# Patient Record
Sex: Female | Born: 1990 | Race: White | Hispanic: No | Smoking: Never smoker
Health system: Southern US, Community
[De-identification: ages and names within clinical notes are randomized; demographics above are authoritative.]

## PROBLEM LIST (undated history)

## (undated) ENCOUNTER — Ambulatory Visit (HOSPITAL_COMMUNITY): Admission: EM | Payer: Self-pay | Source: Home / Self Care

## (undated) DIAGNOSIS — G4700 Insomnia, unspecified: Secondary | ICD-10-CM

## (undated) DIAGNOSIS — E785 Hyperlipidemia, unspecified: Secondary | ICD-10-CM

## (undated) DIAGNOSIS — F329 Major depressive disorder, single episode, unspecified: Secondary | ICD-10-CM

## (undated) DIAGNOSIS — K8020 Calculus of gallbladder without cholecystitis without obstruction: Secondary | ICD-10-CM

## (undated) DIAGNOSIS — L0591 Pilonidal cyst without abscess: Secondary | ICD-10-CM

## (undated) DIAGNOSIS — F32A Depression, unspecified: Secondary | ICD-10-CM

## (undated) DIAGNOSIS — D649 Anemia, unspecified: Secondary | ICD-10-CM

## (undated) DIAGNOSIS — E079 Disorder of thyroid, unspecified: Secondary | ICD-10-CM

## (undated) DIAGNOSIS — F419 Anxiety disorder, unspecified: Secondary | ICD-10-CM

## (undated) HISTORY — DX: Anxiety disorder, unspecified: F41.9

## (undated) HISTORY — DX: Hyperlipidemia, unspecified: E78.5

## (undated) HISTORY — DX: Depression, unspecified: F32.A

## (undated) HISTORY — DX: Insomnia, unspecified: G47.00

## (undated) HISTORY — DX: Anemia, unspecified: D64.9

## (undated) HISTORY — DX: Disorder of thyroid, unspecified: E07.9

## (undated) HISTORY — PX: OTHER SURGICAL HISTORY: SHX169

## (undated) HISTORY — PX: SURGERY OF LIP: SUR1315

---
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
## 2017-11-22 ENCOUNTER — Ambulatory Visit: Payer: Self-pay | Admitting: General Surgery

## 2017-11-22 ENCOUNTER — Encounter: Payer: Self-pay | Admitting: General Surgery

## 2017-11-22 VITALS — BP 115/65 | HR 95 | Temp 98.7°F | Resp 18 | Ht 67.0 in | Wt 133.0 lb

## 2017-11-22 DIAGNOSIS — L0502 Pilonidal sinus with abscess: Secondary | ICD-10-CM

## 2017-11-22 MED ORDER — SULFAMETHOXAZOLE-TRIMETHOPRIM 800-160 MG PO TABS: 1.0000 | ORAL_TABLET | Freq: Two times a day (BID) | ORAL | 0 refills | Status: AC

## 2017-11-22 NOTE — Patient Instructions (Signed)
Pilonidal Cyst A pilonidal cyst is a fluid-filled sac. It forms beneath the skin near your tailbone, at the top of the crease of your buttocks. A pilonidal cyst that is not large or infected may not cause symptoms or problems. If the cyst becomes irritated or infected, it may fill with pus. This causes pain and swelling (pilonidal abscess). An infected cyst may need to be treated with medicine, drained, or removed. What are the causes? The cause of a pilonidal cyst is not known. One cause may be a hair that grows into your skin (ingrown hair). What increases the risk? Pilonidal cysts are more common in boys and men. Risk factors include:  Having lots of hair near the crease of the buttocks.  Being overweight.  Having a pilonidal dimple.  Wearing tight clothing.  Not bathing or showering frequently.  Sitting for long periods of time.  What are the signs or symptoms? Signs and symptoms of a pilonidal cyst may include:  Redness.  Pain and tenderness.  Warmth.  Swelling.  Pus.  Fever.  How is this diagnosed? Your health care provider may diagnose a pilonidal cyst based on your symptoms and a physical exam. The health care provider may do a blood test to check for infection. If your cyst is draining pus, your health care provider may take a sample of the drainage to be tested at a laboratory. How is this treated? Surgery is the usual treatment for an infected pilonidal cyst. You may also have to take medicines before surgery. The type of surgery you have depends on the size and severity of the infected cyst. The different kinds of surgery include:  Incision and drainage. This is a procedure to open and drain the cyst.  Marsupialization. In this procedure, a large cyst or abscess may be opened and kept open by stitching the edges of the skin to the cyst walls.  Cyst removal. This procedure involves opening the skin and removing all or part of the cyst.  Follow these  instructions at home:  Follow all of your surgeon's instructions carefully if you had surgery.  Take medicines only as directed by your health care provider.  If you were prescribed an antibiotic medicine, finish it all even if you start to feel better.  Keep the area around your pilonidal cyst clean and dry.  Clean the area as directed by your health care provider. Pat the area dry with a clean towel. Do not rub it as this may cause bleeding.  Remove hair from the area around the cyst as directed by your health care provider.  Do not wear tight clothing or sit in one place for long periods of time.  There are many different ways to close and cover an incision, including stitches, skin glue, and adhesive strips. Follow your health care provider's instructions on: ? Incision care. ? Bandage (dressing) changes and removal. ? Incision closure removal. Contact a health care provider if:  You have drainage, redness, swelling, or pain at the site of the cyst.  You have a fever. This information is not intended to replace advice given to you by your health care provider. Make sure you discuss any questions you have with your health care provider. Document Released: 07/14/2000 Document Revised: 12/23/2015 Document Reviewed: 12/04/2013 Elsevier Interactive Patient Education  2018 Elsevier Inc.  

## 2017-11-22 NOTE — Progress Notes (Signed)
[facility] History and Physical  Reason for Referral: ? Pilonidal Cyst  Referring Physician:  Dr. Wende CreaseGuarino   Chief Complaint    Cyst      Olivia Sellers is a 27 y.o. female.  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  It has started to get more swollen and red and will occasionally drain clear drainage/ and bloody drainage. It is tender when it is swollen, and for the last few weeks it has been swollen. 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  She denies any fevers or chills, and her sister has had a similar issue in the past but hers resolved with antibiotics.    She is otherwise healthy and takes no medications.    History reviewed. No pertinent past medical history.  Past Surgical History:  Procedure Laterality Date  . SURGERY OF LIP     suture to lip at age 89   Sister with likely pilonidal also  History reviewed. No pertinent family history.  Social History   Tobacco Use  . Smoking status: Never Smoker  . Smokeless tobacco: Never Used  Substance Use Topics  . Alcohol use: Not Currently    Frequency: Never  . Drug use: Never    Medications: I have reviewed the patient's current medications. Allergies as of 11/22/2017   Not on File     Medication List        Accurate as of 11/22/17 11:59 PM. Always use your most recent med list.          sulfamethoxazole-trimethoprim 800-160 MG tablet Commonly known as:  BACTRIM DS,SEPTRA DS Take 1 tablet by mouth 2 (two) times daily for 10 days.        ROS:  A comprehensive review of systems was negative except for: Gastrointestinal: positive for abdominal pain, nausea and reflux symptoms boil on gluteal cleft/ left buttock region  Blood pressure 115/65, pulse 95, temperature 98.7 F (37.1 C), resp. rate 18, height 5\' 7"  (1.702 m), weight 133 lb (60.3 kg). Physical Exam    Constitutional: She is oriented to person, place, and time. She appears well-developed and well-nourished.  HENT:  Head: Normocephalic and atraumatic.  Eyes: Pupils are equal, round, and reactive to light. EOM are normal.  Neck: Normal range of motion.  Cardiovascular: Normal rate and regular rhythm.  Pulmonary/Chest: Effort normal and breath sounds normal.  Abdominal: Soft. She exhibits no distension. There is no tenderness.  Genitourinary:  Genitourinary Comments: Gluteal cleft region with small pit superiorly, indurated track extending to left of midline with <1cm size red papulae without drainage, tender  Musculoskeletal: Normal range of motion. She exhibits no edema.  Neurological: She is alert and oriented to person, place, and time.  Skin: Skin is warm and dry.  Vitals reviewed.   Results: None   Assessment & Plan:  Olivia Sellers is a 27 y.o. 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  She is otherwise healthy and has no evidence of other cystic tracks.    - This is causing her some annoyance, and is tender when it becomes inflamed - She is unsure at this time if she wants to pursue surgery or not, and is self pay - We have given her the  information for the financial assistance people to see what this would cost out of pocket - We would plan for a pilonidal cyst excision with marsupialization of the tissue to help in healing, we have discussed the risk and benefits of this procedure including bleeding, infection, risk of wound splitting open, risk of recurrence.   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   Lucretia Roers 11/23/2017,  11:57 AM

## 2017-11-23 ENCOUNTER — Encounter: Payer: Self-pay | Admitting: General Surgery

## 2019-06-18 ENCOUNTER — Encounter: Payer: Self-pay | Admitting: Family Medicine

## 2019-06-18 ENCOUNTER — Other Ambulatory Visit: Payer: Self-pay

## 2019-06-18 ENCOUNTER — Encounter (INDEPENDENT_AMBULATORY_CARE_PROVIDER_SITE_OTHER): Payer: Self-pay

## 2019-06-18 ENCOUNTER — Ambulatory Visit (INDEPENDENT_AMBULATORY_CARE_PROVIDER_SITE_OTHER): Payer: Self-pay | Admitting: Family Medicine

## 2019-06-18 VITALS — BP 98/70 | HR 100 | Temp 98.8°F | Resp 12 | Ht 67.5 in | Wt 131.1 lb

## 2019-06-18 DIAGNOSIS — M545 Low back pain, unspecified: Secondary | ICD-10-CM

## 2019-06-18 DIAGNOSIS — F418 Other specified anxiety disorders: Secondary | ICD-10-CM | POA: Insufficient documentation

## 2019-06-18 DIAGNOSIS — F329 Major depressive disorder, single episode, unspecified: Secondary | ICD-10-CM

## 2019-06-18 DIAGNOSIS — F419 Anxiety disorder, unspecified: Secondary | ICD-10-CM

## 2019-06-18 DIAGNOSIS — F32A Depression, unspecified: Secondary | ICD-10-CM

## 2019-06-18 DIAGNOSIS — G4726 Circadian rhythm sleep disorder, shift work type: Secondary | ICD-10-CM

## 2019-06-18 DIAGNOSIS — Z30011 Encounter for initial prescription of contraceptive pills: Secondary | ICD-10-CM

## 2019-06-18 DIAGNOSIS — Z124 Encounter for screening for malignant neoplasm of cervix: Secondary | ICD-10-CM

## 2019-06-18 LAB — POCT URINE PREGNANCY: Preg Test, Ur: NEGATIVE

## 2019-06-18 MED ORDER — NORGESTIMATE-ETH ESTRADIOL 0.25-35 MG-MCG PO TABS: 1.0000 | ORAL_TABLET | Freq: Every day | ORAL | 3 refills | Status: DC

## 2019-06-18 NOTE — Patient Instructions (Addendum)
  I appreciate the opportunity to provide you with care for your health and wellness. Today we discussed: establish care   Follow up: 12 month with annual, no pap   No labs or referrals today  GOALS: Increase water intake Get iron-veggies and fruits, beans   BC sent pharmacy   I hope you have a wonderful, happy, safe, and healthy Holiday Season! See you in the New Year :)  Please continue to practice social distancing to keep you, your family, and our community safe.  If you must go out, please wear a mask and practice good handwashing.  It was a pleasure to see you and I look forward to continuing to work together on your health and well-being. Please do not hesitate to call the office if you need care or have questions about your care.  Have a wonderful day and week. With Gratitude, Cherly Beach, DNP, AGNP-BC

## 2019-06-18 NOTE — Progress Notes (Signed)
Subjective:     Patient ID: Olivia Sellers, female   DOB: 20-Sep-1990, 28 y.o.   MRN: [id number]  Olivia Sellers presents for New Patient (Initial Visit) (establish care)  [name] is a 28 year old female patient who presents today to establish care.  Is currently not had a primary care provider in some time.  Usually goes to the health department for her birth control needs.  Currently is working as a Engineer, civil (consulting)nurse at [employer], works night shift.  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  Has a significant history including depression anxiety and anemia.  Family history is consistent for alcohol abuse of mother and father, bipolar disorder of her father schizoaffective disorder in father, depression in mother, COPD/emphysema in mother, panic disorder in mother drug abuse in mother.  Never smoker but did grow up in a home with smokers.  Does not currently have any signs and symptoms of asthma.  Socially drinks alcohol never used drugs sexually active right now uses birth control needs refill today.  Reports not having a Pap ever-mostly due to never having insurance.  Reports growing up in the impoverished home.  Is doing much better now her insurance will pick up in January would like to get in with the GYN for Pap smear.  Currently lives alone but is in a relationship with a significant other.  Feels safe with him.  No pets at home.  Does enjoy drawing in her spare time.  Reports she was eating a lots of fast food but now starting to eat a lot more vegetables and fruits and beans.  More vegetarian like meals.  Drinks about 3 cups of coffee a week.  Does not drink a lot of water but does drink a lot of milk.  Wears her seatbelt in some protection.  Has smoke detectors with good battery.  Does not use her phone while she is driving.  Health maintenance: As stated above needs Pap smear, HIV screening, flu vaccine was provided with [employer] system.  Reports that she had a tetanus while she was  starting school.  Reports that it was probably back in 2014 or 17 she is unsure of the year.  Can get her immunization forms from work to verify.  Today patient denies signs and symptoms of COVID 19 infection including fever, chills, cough, shortness of breath, and headache. Past Medical, Surgical, Social History, Allergies, and Medications have been Reviewed. Past Medical History:  Diagnosis Date  . Anemia    Past Surgical History:  Procedure Laterality Date  . SURGERY OF LIP     suture to lip at age 89  . wisdom teeth removed     Social History   Socioeconomic History  . Marital status: Single    Spouse name: Not on file  . Number of children: Not on file  . Years of education: Not on file  . Highest education level: Not on file  Occupational History  . Occupation: Charity fundraiserN  Social Needs  . Financial resource strain: Not on file  . Food insecurity    Worry: Not on file    Inability: Not on file  . Transportation needs    Medical: Not on file    Non-medical: Not on file  Tobacco Use  . Smoking status: Never Smoker  . Smokeless tobacco: Never Used  Substance and Sexual Activity  . Alcohol use: Not Currently    Frequency: Never  . Drug use: Never  .  Sexual activity: Yes  Lifestyle  . Physical activity    Days per week: Not on file    Minutes per session: Not on file  . Stress: Not on file  Relationships  . Social Musician on phone: Not on file    Gets together: Not on file    Attends religious service: Not on file    Active member of club or organization: Not on file    Attends meetings of clubs or organizations: Not on file    Relationship status: Not on file  . Intimate partner violence    Fear of current or ex partner: Not on file    Emotionally abused: Not on file    Physically abused: Not on file    Forced sexual activity: Not on file  Other Topics Concern  . Not on file  Social History Narrative  . Not on file    Outpatient Encounter  Medications as of 06/18/2019  Medication Sig  . norgestimate-ethinyl estradiol (SPRINTEC 28) 0.25-35 MG-MCG tablet Take 1 tablet by mouth daily.   No facility-administered encounter medications on file as of 06/18/2019.    No Known Allergies  Review of Systems  Constitutional: Negative for chills and fever.  HENT: Negative.   Eyes: Negative.  Negative for visual disturbance.       Glasses-need new ones   Respiratory: Negative.  Negative for cough and shortness of breath.   Cardiovascular: Negative.   Gastrointestinal: Negative.  Negative for constipation, diarrhea, nausea and vomiting.  Endocrine: Negative.   Genitourinary: Negative.   Musculoskeletal: Positive for back pain.  Skin: Negative.   Allergic/Immunologic: Negative.   Neurological: Negative.  Negative for dizziness and headaches.  Hematological: Negative.   Psychiatric/Behavioral: Positive for sleep disturbance. The patient is nervous/anxious.   All other systems reviewed and are negative.      Objective:     BP 98/70   Pulse 100   Temp 98.8 F (37.1 C) (Oral)   Resp 12   Ht 5' 7.5" (1.715 m)   Wt 131 lb 1.9 oz (59.5 kg)   SpO2 99%   BMI 20.23 kg/m   Physical Exam Vitals signs and nursing note reviewed.  Constitutional:      Appearance: Normal appearance. She is well-developed, well-groomed and normal weight.  HENT:     Head: Normocephalic and atraumatic.     Right Ear: External ear normal.     Left Ear: External ear normal.     Nose: Nose normal.     Mouth/Throat:     Mouth: Mucous membranes are moist.     Pharynx: Oropharynx is clear.  Eyes:     General:        Right eye: No discharge.        Left eye: No discharge.     Extraocular Movements: Extraocular movements intact.     Conjunctiva/sclera: Conjunctivae normal.  Neck:     Musculoskeletal: Normal range of motion and neck supple.  Cardiovascular:     Rate and Rhythm: Normal rate and regular rhythm.     Pulses: Normal pulses.     Heart  sounds: Normal heart sounds.  Pulmonary:     Effort: Pulmonary effort is normal.     Breath sounds: Normal breath sounds.  Musculoskeletal: Normal range of motion.  Skin:    General: Skin is warm.  Neurological:     General: No focal deficit present.     Mental Status: She is alert and oriented  to person, place, and time.  Psychiatric:        Attention and Perception: Attention normal.        Mood and Affect: Mood normal.        Speech: Speech normal.        Behavior: Behavior normal. Behavior is cooperative.        Thought Content: Thought content normal.        Cognition and Memory: Cognition normal.        Judgment: Judgment normal.        Assessment and Plan       1. Encounter for screening for malignant neoplasm of cervix Needs to have Pap smear.  Also needs to have screening tests performed.  Referral to OB.  Insurance does not pick up until January advised that she can make her appointment after this date.  - Ambulatory referral to Obstetrics / Gynecology  2. Encounter for initial prescription of contraceptive pills Has been off her birth control for over a month.  Took pregnancy test negative today. Restarted on Sprintec.  Referral to [facility] for possible change in type of birth control.  - Ambulatory referral to Obstetrics / Gynecology - norgestimate-ethinyl estradiol ([place] 28) 0.25-35 MG-MCG tablet; Take 1 tablet by mouth daily.  Dispense: 1 Package; Refill: 3 -POC pregnancy, urine  3. 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  Reports that she uses melatonin to help with her sleep schedule.  Overall doing okay.  4. Anxiety and depression Reports well controlled.  Is practicing yoga a lot more.   5. 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  Reports that her back is a lot better when she does her yoga.  She tries to do it twice a day sometimes  even.   Declines needing any medication at this time for this.   Follow-up November 2021 for annual no Pap  Perlie Mayo, DNP, AGNP-BC [address], [address], [place] [address] Office Hours: Mon-Thurs 8 am-5 pm; Fri 8 am-12 pm Office Phone:  [phone]  Office Fax: [phone]

## 2019-08-04 ENCOUNTER — Encounter: Payer: Self-pay | Admitting: Advanced Practice Midwife

## 2019-08-04 ENCOUNTER — Encounter: Payer: Self-pay | Admitting: Obstetrics & Gynecology

## 2019-08-09 ENCOUNTER — Other Ambulatory Visit: Payer: Self-pay

## 2019-08-09 ENCOUNTER — Encounter (HOSPITAL_COMMUNITY): Payer: Self-pay | Admitting: Emergency Medicine

## 2019-08-09 ENCOUNTER — Emergency Department (HOSPITAL_COMMUNITY): Payer: No Typology Code available for payment source

## 2019-08-09 ENCOUNTER — Emergency Department (HOSPITAL_COMMUNITY)
Admission: EM | Admit: 2019-08-09 | Discharge: 2019-08-09 | Disposition: A | Discharge status: Home / Self Care | Payer: No Typology Code available for payment source | Attending: Emergency Medicine | Admitting: Emergency Medicine

## 2019-08-09 DIAGNOSIS — R1011 Right upper quadrant pain: Secondary | ICD-10-CM | POA: Diagnosis not present

## 2019-08-09 DIAGNOSIS — R1013 Epigastric pain: Secondary | ICD-10-CM | POA: Diagnosis present

## 2019-08-09 DIAGNOSIS — K8020 Calculus of gallbladder without cholecystitis without obstruction: Secondary | ICD-10-CM | POA: Diagnosis not present

## 2019-08-09 DIAGNOSIS — Z79899 Other long term (current) drug therapy: Secondary | ICD-10-CM | POA: Diagnosis not present

## 2019-08-09 LAB — COMPREHENSIVE METABOLIC PANEL
ALT: 22 U/L (ref 0–44)
AST: 31 U/L (ref 15–41)
Albumin: 3.9 g/dL (ref 3.5–5.0)
Alkaline Phosphatase: 37 U/L — ABNORMAL LOW (ref 38–126)
Anion gap: 13 (ref 5–15)
BUN: 12 mg/dL (ref 6–20)
CO2: 20 mmol/L — ABNORMAL LOW (ref 22–32)
Calcium: 9.3 mg/dL (ref 8.9–10.3)
Chloride: 102 mmol/L (ref 98–111)
Creatinine, Ser: 0.68 mg/dL (ref 0.44–1.00)
GFR calc Af Amer: >60 mL/min (ref >60)
GFR calc non Af Amer: >60 mL/min (ref >60)
Glucose, Bld: 108 mg/dL — ABNORMAL HIGH (ref 70–99)
Potassium: 3.4 mmol/L — ABNORMAL LOW (ref 3.5–5.1)
Sodium: 135 mmol/L (ref 135–145)
Total Bilirubin: 0.7 mg/dL (ref 0.3–1.2)
Total Protein: 6.8 g/dL (ref 6.5–8.1)

## 2019-08-09 LAB — LIPASE, BLOOD: Lipase: 25 U/L (ref 11–51)

## 2019-08-09 LAB — CBC WITH DIFFERENTIAL/PLATELET
Abs Immature Granulocytes: 0.03 10*3/uL (ref 0.00–0.07)
Basophils Absolute: 0 10*3/uL (ref 0.0–0.1)
Basophils Relative: 0 %
Eosinophils Absolute: 0.1 10*3/uL (ref 0.0–0.5)
Eosinophils Relative: 1 %
HCT: 37.3 % (ref 36.0–46.0)
Hemoglobin: 12.7 g/dL (ref 12.0–15.0)
Immature Granulocytes: 0 %
Lymphocytes Relative: 30 %
Lymphs Abs: 2.7 10*3/uL (ref 0.7–4.0)
MCH: 30.2 pg (ref 26.0–34.0)
MCHC: 34 g/dL (ref 30.0–36.0)
MCV: 88.6 fL (ref 80.0–100.0)
Monocytes Absolute: 0.6 10*3/uL (ref 0.1–1.0)
Monocytes Relative: 6 %
Neutro Abs: 5.7 10*3/uL (ref 1.7–7.7)
Neutrophils Relative %: 63 %
Platelets: 264 10*3/uL (ref 150–400)
RBC: 4.21 MIL/uL (ref 3.87–5.11)
RDW: 12.3 % (ref 11.5–15.5)
WBC: 9.1 10*3/uL (ref 4.0–10.5)
nRBC: 0 % (ref 0.0–0.2)

## 2019-08-09 LAB — URINALYSIS, ROUTINE W REFLEX MICROSCOPIC
Bilirubin Urine: NEGATIVE
Glucose, UA: NEGATIVE mg/dL
Hgb urine dipstick: NEGATIVE
Ketones, ur: 20 mg/dL — AB
Leukocytes,Ua: NEGATIVE
Nitrite: NEGATIVE
Protein, ur: NEGATIVE mg/dL
Specific Gravity, Urine: 1.021 (ref 1.005–1.030)
pH: 5 (ref 5.0–8.0)

## 2019-08-09 LAB — I-STAT BETA HCG BLOOD, ED (MC, WL, AP ONLY): I-stat hCG, quantitative: <5 m[IU]/mL (ref <5)

## 2019-08-09 MED ORDER — MORPHINE SULFATE (PF) 4 MG/ML IV SOLN
4.0000 mg | Freq: Once | INTRAVENOUS | Status: AC
  Administered 2019-08-09: 4 mg via INTRAVENOUS
  Filled 2019-08-09: qty 1

## 2019-08-09 MED ORDER — ONDANSETRON HCL 4 MG/2ML IJ SOLN
4.0000 mg | Freq: Once | INTRAMUSCULAR | Status: AC
  Administered 2019-08-09: 02:00:00 4 mg via INTRAVENOUS
  Filled 2019-08-09: qty 2

## 2019-08-09 NOTE — ED Triage Notes (Signed)
Reports upper abdominal pain that radiates into the back.  Has had a similar episode after eating.  Denies nausea.

## 2019-08-09 NOTE — ED Notes (Signed)
Pt says that she is tolerating water without difficulty

## 2019-08-09 NOTE — Discharge Instructions (Addendum)
1. Medications: usual home medications 2. Treatment: rest, drink plenty of fluids, advance diet slowly 3. Follow Up: Please followup with general surgery in 2-3 days for further evaluation; Please return to the ER for worsening pain, persistent vomiting, high fevers or worsening symptoms

## 2019-08-09 NOTE — ED Notes (Signed)
Taken to US at this time. 

## 2019-08-09 NOTE — ED Provider Notes (Signed)
[facility] EMERGENCY DEPARTMENT Provider Note   CSN: [id number] Arrival date & time: 08/09/19  0125     History Chief Complaint  Patient presents with  . Abdominal Pain    Olivia Sellers is a 29 y.o. female with a hx of anemia, anxiety, depression presents to the Emergency Department complaining of gradual, persistent, progressively worsening epigastric and right upper quadrant abdominal pain that began around 9 PM this evening.  Patient reports patient is a 10/10, sharp and radiates to the right flank and right upper back.  She reports eating dinner around 5:30 PM without pain.  She denies nausea or vomiting.  Patient reports she had intermittent episodes like this, often associated with food for approximately 1 year.  She reports that yoga and changing positions often helps the pain.  Tonight nothing made her pain better.  She reports that standing and walking seems to make her pain worse.  She has never had any abdominal surgeries and has never had any evaluation of her gallbladder.  Patient denies vaginal and urinary symptoms.  The history is provided by the patient and medical records. No language interpreter was used.       Past Medical History:  Diagnosis Date  . Anemia   . Anxiety   . Depression     Patient Active Problem List   Diagnosis Date Noted  . Anxiety and depression 06/18/2019  . Shifting sleep-work schedule 06/18/2019    Past Surgical History:  Procedure Laterality Date  . SURGERY OF LIP     suture to lip at age 10  . wisdom teeth removed       OB History   No obstetric history on file.     Family History  Problem Relation Age of Onset  . Panic disorder Mother   . Drug abuse Mother   . Alcohol abuse Mother   . Depression Mother   . COPD Mother   . Bipolar disorder Father   . Alcohol abuse Father     Social History   Tobacco Use  . Smoking status: Never Smoker  . Smokeless tobacco: Never Used  Substance Use Topics  .  Alcohol use: Yes    Comment: socially   . Drug use: Never    Home Medications Prior to Admission medications   Medication Sig Start Date End Date Taking? Authorizing Provider  norgestimate-ethinyl estradiol (SPRINTEC 28) 0.25-35 MG-MCG tablet Take 1 tablet by mouth daily. 06/18/19  Yes Freddy Finner, NP    Allergies    Patient has no known allergies.  Review of Systems   Review of Systems  Constitutional: Negative for appetite change, diaphoresis, fatigue, fever and unexpected weight change.  HENT: Negative for mouth sores.   Eyes: Negative for visual disturbance.  Respiratory: Negative for cough, chest tightness, shortness of breath and wheezing.   Cardiovascular: Negative for chest pain.  Gastrointestinal: Positive for abdominal pain. Negative for constipation, diarrhea, nausea and vomiting.  Endocrine: Negative for polydipsia, polyphagia and polyuria.  Genitourinary: Positive for flank pain. Negative for dysuria, frequency, hematuria and urgency.  Musculoskeletal: Negative for back pain and neck stiffness.  Skin: Negative for rash.  Allergic/Immunologic: Negative for immunocompromised state.  Neurological: Negative for syncope, light-headedness and headaches.  Hematological: Does not bruise/bleed easily.  Psychiatric/Behavioral: Negative for sleep disturbance. The patient is not nervous/anxious.     Physical Exam Updated Vital Signs BP 119/79 (BP Location: Right Arm)   Pulse 93   Temp 97.8 F (36.6 C) (Oral)  Resp 16   Ht 5' 7.5" (1.715 m)   Wt 56.7 kg   SpO2 100%   BMI 19.29 kg/m   Physical Exam Vitals and nursing note reviewed.  Constitutional:      General: She is not in acute distress.    Appearance: She is not diaphoretic.  HENT:     Head: Normocephalic.  Eyes:     General: No scleral icterus.    Conjunctiva/sclera: Conjunctivae normal.  Cardiovascular:     Rate and Rhythm: Normal rate and regular rhythm.     Pulses: Normal pulses.          Radial  pulses are 2+ on the right side and 2+ on the left side.  Pulmonary:     Effort: No tachypnea, accessory muscle usage, prolonged expiration, respiratory distress or retractions.     Breath sounds: Normal breath sounds. No stridor. No wheezing or rhonchi.     Comments: Equal chest rise. No increased work of breathing. Abdominal:     General: There is no distension.     Palpations: Abdomen is soft.     Tenderness: There is abdominal tenderness in the right upper quadrant and epigastric area. There is guarding. There is no right CVA tenderness, left CVA tenderness or rebound.  Musculoskeletal:     Cervical back: Normal range of motion.     Comments: Moves all extremities equally and without difficulty.  Skin:    General: Skin is warm and dry.     Capillary Refill: Capillary refill takes less than 2 seconds.  Neurological:     Mental Status: She is alert.     GCS: GCS eye subscore is 4. GCS verbal subscore is 5. GCS motor subscore is 6.     Comments: Speech is clear and goal oriented.  Psychiatric:        Mood and Affect: Mood normal.     ED Results / Procedures / Treatments   Labs (all labs ordered are listed, but only abnormal results are displayed) Labs Reviewed  COMPREHENSIVE METABOLIC PANEL - Abnormal; Notable for the following components:      Result Value   Potassium 3.4 (*)    CO2 20 (*)    Glucose, Bld 108 (*)    Alkaline Phosphatase 37 (*)    All other components within normal limits  URINALYSIS, ROUTINE W REFLEX MICROSCOPIC - Abnormal; Notable for the following components:   Ketones, ur 20 (*)    All other components within normal limits  CBC WITH DIFFERENTIAL/PLATELET  LIPASE, BLOOD  I-STAT BETA HCG BLOOD, ED ([facility], [facility], [facility] ONLY)    Radiology US Abdomen Limited  Result Date: 08/09/2019 CLINICAL DATA:  Right upper quadrant pain after eating EXAM: ULTRASOUND ABDOMEN LIMITED RIGHT UPPER QUADRANT COMPARISON:  None. FINDINGS: Gallbladder: Multiple layering gallstones are  present. The largest measuring 3.6 mm. Gallbladder wall thickness measures 2 mm. Common bile duct: Diameter: 5.6 mm Liver: Increased echotexture seen throughout. No focal abnormality or biliary ductal dilatation. Portal vein is patent on color Doppler imaging with normal direction of blood flow towards the liver. Other: None. IMPRESSION: Cholelithiasis without definite evidence of acute cholecystitis. Hepatic steatosis. Electronically Signed   By: Prudencio Pair M.D.   On: 08/09/2019 03:05    Procedures Procedures (including critical care time)  Medications Ordered in ED Medications  morphine 4 MG/ML injection 4 mg (4 mg Intravenous Given 08/09/19 0209)  ondansetron (ZOFRAN) injection 4 mg (4 mg Intravenous Given 08/09/19 0216)    ED Course  I have reviewed the triage vital signs and the nursing notes.  Pertinent labs & imaging results that were available during my care of the patient were reviewed by me and considered in my medical decision making (see chart for details).  Clinical Course as of Aug 09 731  Sat Aug 09, 2019  0217 No leukocytosis  WBC: 9.1 [HM]  0217 Test negative  I-stat hCG, quantitative: <5.0 [HM]  0217 No evidence of urinary tract infection  Nitrite: NEGATIVE [HM]  0217 Afebrile without tachycardia or hypotension.  No evidence of sepsis.  Temp: 97.8 F (36.6 C) [HM]    Clinical Course User Index [HM] Sojourner Behringer, Boyd Kerbs   MDM Rules/Calculators/A&P                      Patient presents with severe epigastric and right upper quadrant abdominal pain.  She does have some guarding on exam.  Not specifically positive Murphy sign.  She appears very uncomfortable.  Will give pain meds.  Labs and imaging pending.  Suspect cholecystitis/cholelithiasis versus choledocholithiasis.  Will also consider pancreatitis.  No Covid symptoms.  04:00 Patient reports she is feeling better.  Her pain is almost resolved.  On repeat exam her abdomen is soft and nontender.  No Murphy  sign.  Ultrasound does show gallstones without evidence of cholecystitis or choledocholithiasis.  Suspect patient's pain is secondary to biliary colic.  Patient declines pain control and nausea medication for home.  She will need close outpatient follow-up with surgical team.  Discussed reasons to return to the emergency department and patient states understanding.   Final Clinical Impression(s) / ED Diagnoses Final diagnoses:  Right upper quadrant abdominal pain  Calculus of gallbladder without cholecystitis without obstruction    Rx / DC Orders ED Discharge Orders    None       Milta Deiters 08/09/19 2563    Zadie Rhine, MD 08/09/19 [phone]

## 2019-08-19 ENCOUNTER — Other Ambulatory Visit (HOSPITAL_COMMUNITY)
Admission: RE | Admit: 2019-08-19 | Discharge: 2019-08-19 | Disposition: A | Discharge status: Home / Self Care | Payer: No Typology Code available for payment source | Source: Ambulatory Visit | Attending: General Surgery | Admitting: General Surgery

## 2019-08-19 ENCOUNTER — Encounter (HOSPITAL_BASED_OUTPATIENT_CLINIC_OR_DEPARTMENT_OTHER): Payer: Self-pay | Admitting: General Surgery

## 2019-08-19 DIAGNOSIS — Z01812 Encounter for preprocedural laboratory examination: Secondary | ICD-10-CM | POA: Insufficient documentation

## 2019-08-19 DIAGNOSIS — Z20822 Contact with and (suspected) exposure to covid-19: Secondary | ICD-10-CM | POA: Insufficient documentation

## 2019-08-20 ENCOUNTER — Ambulatory Visit (INDEPENDENT_AMBULATORY_CARE_PROVIDER_SITE_OTHER): Payer: No Typology Code available for payment source | Admitting: Adult Health

## 2019-08-20 ENCOUNTER — Other Ambulatory Visit: Payer: Self-pay

## 2019-08-20 ENCOUNTER — Encounter: Payer: Self-pay | Admitting: Adult Health

## 2019-08-20 ENCOUNTER — Other Ambulatory Visit (HOSPITAL_COMMUNITY)
Admission: RE | Admit: 2019-08-20 | Discharge: 2019-08-20 | Disposition: A | Discharge status: Home / Self Care | Payer: No Typology Code available for payment source | Source: Ambulatory Visit | Attending: Obstetrics & Gynecology | Admitting: Obstetrics & Gynecology

## 2019-08-20 ENCOUNTER — Encounter (HOSPITAL_BASED_OUTPATIENT_CLINIC_OR_DEPARTMENT_OTHER): Payer: Self-pay | Admitting: General Surgery

## 2019-08-20 ENCOUNTER — Ambulatory Visit: Payer: Self-pay | Admitting: General Surgery

## 2019-08-20 VITALS — BP 105/71 | HR 102 | Ht 67.5 in | Wt 131.0 lb

## 2019-08-20 DIAGNOSIS — Z01419 Encounter for gynecological examination (general) (routine) without abnormal findings: Secondary | ICD-10-CM | POA: Insufficient documentation

## 2019-08-20 DIAGNOSIS — Z3009 Encounter for other general counseling and advice on contraception: Secondary | ICD-10-CM | POA: Insufficient documentation

## 2019-08-20 HISTORY — DX: Encounter for gynecological examination (general) (routine) without abnormal findings: Z01.419

## 2019-08-20 HISTORY — DX: Encounter for other general counseling and advice on contraception: Z30.09

## 2019-08-20 LAB — NOVEL CORONAVIRUS, NAA (HOSP ORDER, SEND-OUT TO REF LAB; TAT 18-24 HRS): SARS-CoV-2, NAA: NOT DETECTED

## 2019-08-20 NOTE — Progress Notes (Signed)
Spoke w/ via phone for pre-op interview---[facility] needs dos----   Ask mda if needs urine poct, urine I state beta hcg done 08-09-2019  Last menustral cycle 08-12-2019        Lab results------cbc with dif, I stat hcg blood, cmet, ua done 08-09-2019 chart/epic COVID test ------08-19-2019 Arrive at -------830 am 08-22-2019 NPO after ------midnight Medications to take morning of surgery -----none Diabetic medication -----n/a Patient Special Instructions ----- Pre-Op special Istructions ----- Patient verbalized understanding of instructions that were given at this phone interview. Patient denies shortness of breath, chest pain, fever, cough a this phone interview.

## 2019-08-20 NOTE — Progress Notes (Signed)
Patient ID: Olivia Sellers, female   DOB: 19-May-1991, 29 y.o.   MRN: [id number] History of Present Illness:  Olivia Sellers is a 29 year old white female, single, G0P0, in for a well woman gyn exam and her first pap.She is Olivia Sellers at [employer].She is having Gallbladder surgery 08/22/19 at [facility]. She is on OCs but wants to talk IUD and nexplanon. PCP is Tereasa Coop NP.  Current Medications, Allergies, Past Medical History, Past Surgical History, Family History and Social History were reviewed in [employer] record.     Review of Systems: Patient denies any headaches, hearing loss, fatigue, blurred vision, shortness of breath, chest pain, abdominal pain, problems with bowel movements, urination, or intercourse. No joint pain or mood swings.    Physical Exam:BP 105/71 (BP Location: Left Arm, Patient Position: Sitting, Cuff Size: Normal)   Pulse (!) 102   Ht 5' 7.5" (1.715 m)   Wt 131 lb (59.4 kg)   LMP 08/12/2019 (Exact Date)   BMI 20.21 kg/m  General:  Well developed, well nourished, no acute distress Skin:  Warm and dry Neck:  Midline trachea, normal thyroid, good ROM, no lymphadenopathy Lungs; Clear to auscultation bilaterally Breast:  No dominant palpable mass, retraction, or nipple discharge Cardiovascular: Regular rate and rhythm Abdomen:  Soft, non tender, no hepatosplenomegaly Pelvic:  External genitalia is normal in appearance, no lesions.  The vagina is normal in appearance. Urethra has no lesions or masses. The cervix is nulliparous, pap with high risk HPV 16/18 genotyping performed.  Uterus is felt to be normal size, shape, and contour.  No adnexal masses or tenderness noted.Bladder is non tender, no masses felt. Extremities/musculoskeletal:  No swelling or varicosities noted, no clubbing or cyanosis Psych:  No mood changes, alert and cooperative,seems happy Fall risk is low PHQ 2 score 0. Examination chaperoned by Marchelle Folks Rash LPN.  Impression and Plan: 1.  Encounter for gynecological examination with Papanicolaou smear of cervix Pap sent Physical in 1 year Pap in 3 if normal Labs with PCP Mammogram at 41  2. General counseling and advice for contraceptive management Discussed IUD vs Nexplanon and is wants nexplanon Review handout on nexplanon and IUD Continue OCs and use condoms  Return 2/9 for Nexplanon insertion

## 2019-08-21 NOTE — Anesthesia Preprocedure Evaluation (Addendum)
Anesthesia Evaluation  Patient identified by MRN, date of birth, ID band Patient awake    Reviewed: Allergy & Precautions, NPO status , Patient's Chart, lab work & pertinent test results  History of Anesthesia Complications Negative for: history of anesthetic complications  Airway Mallampati: I  TM Distance: >3 FB Neck ROM: Full    Dental no notable dental hx.    Pulmonary neg pulmonary ROS,    Pulmonary exam normal        Cardiovascular negative cardio ROS Normal cardiovascular exam     Neuro/Psych Anxiety Depression negative neurological ROS     GI/Hepatic Neg liver ROS, Cholelithiasis   Endo/Other  negative endocrine ROS  Renal/GU negative Renal ROS  negative genitourinary   Musculoskeletal negative musculoskeletal ROS (+)   Abdominal   Peds  Hematology negative hematology ROS (+)   Anesthesia Other Findings Day of surgery medications reviewed with patient.  Reproductive/Obstetrics negative OB ROS                            Anesthesia Physical Anesthesia Plan  ASA: I  Anesthesia Plan: General   Post-op Pain Management:    Induction: Intravenous  PONV Risk Score and Plan: Treatment may vary due to age or medical condition, Ondansetron, Dexamethasone, Midazolam and Scopolamine patch - Pre-op  Airway Management Planned: Oral ETT  Additional Equipment: None  Intra-op Plan:   Post-operative Plan: Extubation in OR  Informed Consent: I have reviewed the patients History and Physical, chart, labs and discussed the procedure including the risks, benefits and alternatives for the proposed anesthesia with the patient or authorized representative who has indicated his/her understanding and acceptance.     Dental advisory given  Plan Discussed with: CRNA  Anesthesia Plan Comments:        Anesthesia Quick Evaluation

## 2019-08-22 ENCOUNTER — Ambulatory Visit (HOSPITAL_BASED_OUTPATIENT_CLINIC_OR_DEPARTMENT_OTHER): Payer: No Typology Code available for payment source | Admitting: Anesthesiology

## 2019-08-22 ENCOUNTER — Other Ambulatory Visit: Payer: Self-pay

## 2019-08-22 ENCOUNTER — Encounter (HOSPITAL_BASED_OUTPATIENT_CLINIC_OR_DEPARTMENT_OTHER)
Admission: RE | Disposition: A | Discharge status: Home / Self Care | Payer: Self-pay | Source: Home / Self Care | Attending: General Surgery

## 2019-08-22 ENCOUNTER — Encounter (HOSPITAL_BASED_OUTPATIENT_CLINIC_OR_DEPARTMENT_OTHER): Payer: Self-pay | Admitting: General Surgery

## 2019-08-22 ENCOUNTER — Ambulatory Visit (HOSPITAL_BASED_OUTPATIENT_CLINIC_OR_DEPARTMENT_OTHER)
Admission: RE | Admit: 2019-08-22 | Discharge: 2019-08-22 | Disposition: A | Discharge status: Home / Self Care | Payer: No Typology Code available for payment source | Attending: General Surgery | Admitting: General Surgery

## 2019-08-22 DIAGNOSIS — Z811 Family history of alcohol abuse and dependence: Secondary | ICD-10-CM | POA: Diagnosis not present

## 2019-08-22 DIAGNOSIS — K8010 Calculus of gallbladder with chronic cholecystitis without obstruction: Secondary | ICD-10-CM | POA: Diagnosis not present

## 2019-08-22 DIAGNOSIS — F419 Anxiety disorder, unspecified: Secondary | ICD-10-CM | POA: Insufficient documentation

## 2019-08-22 DIAGNOSIS — Z825 Family history of asthma and other chronic lower respiratory diseases: Secondary | ICD-10-CM | POA: Diagnosis not present

## 2019-08-22 DIAGNOSIS — Z818 Family history of other mental and behavioral disorders: Secondary | ICD-10-CM | POA: Insufficient documentation

## 2019-08-22 DIAGNOSIS — K8020 Calculus of gallbladder without cholecystitis without obstruction: Secondary | ICD-10-CM | POA: Diagnosis present

## 2019-08-22 DIAGNOSIS — F329 Major depressive disorder, single episode, unspecified: Secondary | ICD-10-CM | POA: Insufficient documentation

## 2019-08-22 DIAGNOSIS — D649 Anemia, unspecified: Secondary | ICD-10-CM | POA: Diagnosis not present

## 2019-08-22 HISTORY — DX: Pilonidal cyst without abscess: L05.91

## 2019-08-22 HISTORY — DX: Calculus of gallbladder without cholecystitis without obstruction: K80.20

## 2019-08-22 HISTORY — PX: CHOLECYSTECTOMY: SHX55

## 2019-08-22 SURGERY — LAPAROSCOPIC CHOLECYSTECTOMY
Anesthesia: General | Site: Abdomen

## 2019-08-22 MED ORDER — PROPOFOL 10 MG/ML IV BOLUS
INTRAVENOUS | Status: DC | PRN
  Administered 2019-08-22: 130 mg via INTRAVENOUS

## 2019-08-22 MED ORDER — FENTANYL CITRATE (PF) 100 MCG/2ML IJ SOLN
INTRAMUSCULAR | Status: AC
  Filled 2019-08-22: qty 2

## 2019-08-22 MED ORDER — OXYCODONE HCL 5 MG PO TABS
ORAL_TABLET | ORAL | Status: AC
  Filled 2019-08-22: qty 1

## 2019-08-22 MED ORDER — LACTATED RINGERS IV SOLN
INTRAVENOUS | Status: DC
  Filled 2019-08-22: qty 1000

## 2019-08-22 MED ORDER — CHLORHEXIDINE GLUCONATE CLOTH 2 % EX PADS
6.0000 | MEDICATED_PAD | Freq: Once | CUTANEOUS | Status: DC
  Filled 2019-08-22: qty 6

## 2019-08-22 MED ORDER — SCOPOLAMINE 1 MG/3DAYS TD PT72
1.0000 | MEDICATED_PATCH | Freq: Once | TRANSDERMAL | Status: DC
  Administered 2019-08-22: 09:00:00 1.5 mg via TRANSDERMAL
  Filled 2019-08-22: qty 1

## 2019-08-22 MED ORDER — SCOPOLAMINE 1 MG/3DAYS TD PT72
MEDICATED_PATCH | TRANSDERMAL | Status: AC
  Filled 2019-08-22: qty 1

## 2019-08-22 MED ORDER — PROMETHAZINE HCL 25 MG/ML IJ SOLN
6.2500 mg | INTRAMUSCULAR | Status: DC | PRN
  Administered 2019-08-22: 6.25 mg via INTRAVENOUS
  Filled 2019-08-22: qty 1

## 2019-08-22 MED ORDER — CELECOXIB 200 MG PO CAPS
ORAL_CAPSULE | ORAL | Status: AC
  Filled 2019-08-22: qty 1

## 2019-08-22 MED ORDER — MIDAZOLAM HCL 5 MG/5ML IJ SOLN
INTRAMUSCULAR | Status: DC | PRN
  Administered 2019-08-22: 2 mg via INTRAVENOUS

## 2019-08-22 MED ORDER — ROCURONIUM BROMIDE 10 MG/ML (PF) SYRINGE
PREFILLED_SYRINGE | INTRAVENOUS | Status: DC | PRN
  Administered 2019-08-22: 30 mg via INTRAVENOUS

## 2019-08-22 MED ORDER — CELECOXIB 200 MG PO CAPS
200.0000 mg | ORAL_CAPSULE | ORAL | Status: AC
  Administered 2019-08-22: 09:00:00 200 mg via ORAL
  Filled 2019-08-22: qty 1

## 2019-08-22 MED ORDER — DEXAMETHASONE SODIUM PHOSPHATE 10 MG/ML IJ SOLN
INTRAMUSCULAR | Status: DC | PRN
  Administered 2019-08-22: 10 mg via INTRAVENOUS

## 2019-08-22 MED ORDER — CEFAZOLIN SODIUM-DEXTROSE 2-4 GM/100ML-% IV SOLN
INTRAVENOUS | Status: AC
  Filled 2019-08-22: qty 100

## 2019-08-22 MED ORDER — SUGAMMADEX SODIUM 200 MG/2ML IV SOLN
INTRAVENOUS | Status: DC | PRN
  Administered 2019-08-22: 150 mg via INTRAVENOUS

## 2019-08-22 MED ORDER — LIDOCAINE 2% (20 MG/ML) 5 ML SYRINGE
INTRAMUSCULAR | Status: DC | PRN
  Administered 2019-08-22: 60 mg via INTRAVENOUS

## 2019-08-22 MED ORDER — KETOROLAC TROMETHAMINE 30 MG/ML IJ SOLN
30.0000 mg | Freq: Once | INTRAMUSCULAR | Status: AC
  Administered 2019-08-22: 14:00:00 30 mg via INTRAVENOUS
  Filled 2019-08-22: qty 1

## 2019-08-22 MED ORDER — TRAMADOL HCL 50 MG PO TABS: 50.0000 mg | ORAL_TABLET | Freq: Four times a day (QID) | ORAL | 0 refills | Status: DC | PRN

## 2019-08-22 MED ORDER — PROMETHAZINE HCL 25 MG/ML IJ SOLN
INTRAMUSCULAR | Status: AC
  Filled 2019-08-22: qty 1

## 2019-08-22 MED ORDER — DEXAMETHASONE SODIUM PHOSPHATE 10 MG/ML IJ SOLN
INTRAMUSCULAR | Status: AC
  Filled 2019-08-22: qty 1

## 2019-08-22 MED ORDER — MIDAZOLAM HCL 2 MG/2ML IJ SOLN
INTRAMUSCULAR | Status: AC
  Filled 2019-08-22: qty 2

## 2019-08-22 MED ORDER — FENTANYL CITRATE (PF) 100 MCG/2ML IJ SOLN
INTRAMUSCULAR | Status: DC | PRN
  Administered 2019-08-22 (×4): 50 ug via INTRAVENOUS

## 2019-08-22 MED ORDER — OXYCODONE HCL 5 MG/5ML PO SOLN
5.0000 mg | Freq: Once | ORAL | Status: AC | PRN
  Filled 2019-08-22: qty 5

## 2019-08-22 MED ORDER — BUPIVACAINE-EPINEPHRINE (PF) 0.25% -1:200000 IJ SOLN
INTRAMUSCULAR | Status: DC | PRN
  Administered 2019-08-22: 12 mL

## 2019-08-22 MED ORDER — ROCURONIUM BROMIDE 10 MG/ML (PF) SYRINGE
PREFILLED_SYRINGE | INTRAVENOUS | Status: AC
  Filled 2019-08-22: qty 10

## 2019-08-22 MED ORDER — ONDANSETRON HCL 4 MG/2ML IJ SOLN
INTRAMUSCULAR | Status: DC | PRN
  Administered 2019-08-22: 4 mg via INTRAVENOUS

## 2019-08-22 MED ORDER — LIDOCAINE 2% (20 MG/ML) 5 ML SYRINGE
INTRAMUSCULAR | Status: AC
  Filled 2019-08-22: qty 5

## 2019-08-22 MED ORDER — PROPOFOL 10 MG/ML IV BOLUS
INTRAVENOUS | Status: AC
  Filled 2019-08-22: qty 20

## 2019-08-22 MED ORDER — ONDANSETRON HCL 4 MG/2ML IJ SOLN
INTRAMUSCULAR | Status: AC
  Filled 2019-08-22: qty 2

## 2019-08-22 MED ORDER — FENTANYL CITRATE (PF) 100 MCG/2ML IJ SOLN
25.0000 ug | INTRAMUSCULAR | Status: DC | PRN
  Administered 2019-08-22 (×2): 25 ug via INTRAVENOUS
  Administered 2019-08-22: 12:00:00 50 ug via INTRAVENOUS
  Filled 2019-08-22: qty 1

## 2019-08-22 MED ORDER — ACETAMINOPHEN 500 MG PO TABS
ORAL_TABLET | ORAL | Status: AC
  Filled 2019-08-22: qty 2

## 2019-08-22 MED ORDER — ACETAMINOPHEN 500 MG PO TABS
1000.0000 mg | ORAL_TABLET | ORAL | Status: AC
  Administered 2019-08-22: 09:00:00 1000 mg via ORAL
  Filled 2019-08-22: qty 2

## 2019-08-22 MED ORDER — KETOROLAC TROMETHAMINE 30 MG/ML IJ SOLN
INTRAMUSCULAR | Status: AC
  Filled 2019-08-22: qty 1

## 2019-08-22 MED ORDER — OXYCODONE HCL 5 MG PO TABS
5.0000 mg | ORAL_TABLET | Freq: Once | ORAL | Status: AC | PRN
  Administered 2019-08-22: 13:00:00 5 mg via ORAL
  Filled 2019-08-22: qty 1

## 2019-08-22 MED ORDER — CEFAZOLIN SODIUM-DEXTROSE 2-4 GM/100ML-% IV SOLN
2.0000 g | INTRAVENOUS | Status: AC
  Administered 2019-08-22: 10:00:00 2 g via INTRAVENOUS
  Filled 2019-08-22: qty 100

## 2019-08-22 SURGICAL SUPPLY — 35 items
CABLE HIGH FREQUENCY MONO STRZ (ELECTRODE) ×3 IMPLANT
CHLORAPREP W/TINT 26 (MISCELLANEOUS) ×3 IMPLANT
CLIP VESOLOCK MED LG 6/CT (CLIP) ×6 IMPLANT
COVER MAYO STAND STRL (DRAPES) ×3 IMPLANT
COVER TRANSDUCER ULTRASND (DRAPES) ×3 IMPLANT
DERMABOND ADVANCED (GAUZE/BANDAGES/DRESSINGS) ×2
DERMABOND ADVANCED .7 DNX12 (GAUZE/BANDAGES/DRESSINGS) ×1 IMPLANT
DRAPE C-ARM 42X120 X-RAY (DRAPES) ×3 IMPLANT
ELECT REM PT RETURN 15FT ADLT (MISCELLANEOUS) ×3 IMPLANT
GAUZE SPONGE 2X2 8PLY STRL LF (GAUZE/BANDAGES/DRESSINGS) ×1 IMPLANT
GLOVE BIO SURGEON STRL SZ7 (GLOVE) ×3 IMPLANT
GLOVE BIO SURGEON STRL SZ7.5 (GLOVE) ×3 IMPLANT
GLOVE BIOGEL PI IND STRL 7.0 (GLOVE) ×4 IMPLANT
GLOVE BIOGEL PI INDICATOR 7.0 (GLOVE) ×8
GOWN STRL REUS W/TWL LRG LVL3 (GOWN DISPOSABLE) ×6 IMPLANT
GOWN STRL REUS W/TWL XL LVL3 (GOWN DISPOSABLE) ×9 IMPLANT
GRASPER SUT TROCAR 14GX15 (MISCELLANEOUS) ×3 IMPLANT
IV NS IRRIG 3000ML ARTHROMATIC (IV SOLUTION) ×3 IMPLANT
KIT BASIN OR (CUSTOM PROCEDURE TRAY) ×3 IMPLANT
KIT TURNOVER CYSTO (KITS) ×3 IMPLANT
NEEDLE INSUFFLATION 14GA 120MM (NEEDLE) ×3 IMPLANT
PACK BASIN DAY SURGERY FS (CUSTOM PROCEDURE TRAY) ×3 IMPLANT
POUCH RETRIEVAL ECOSAC 10 (ENDOMECHANICALS) ×1 IMPLANT
POUCH RETRIEVAL ECOSAC 10MM (ENDOMECHANICALS) ×2
SCISSORS LAP 5X35 DISP (ENDOMECHANICALS) ×3 IMPLANT
SET CHOLANGIOGRAPH MIX (MISCELLANEOUS) ×3 IMPLANT
SET IRRIG TUBING LAPAROSCOPIC (IRRIGATION / IRRIGATOR) ×3 IMPLANT
SET TUBE SMOKE EVAC HIGH FLOW (TUBING) ×3 IMPLANT
SLEEVE XCEL OPT CAN 5 100 (ENDOMECHANICALS) ×3 IMPLANT
SPONGE GAUZE 2X2 STER 10/PKG (GAUZE/BANDAGES/DRESSINGS) ×2
SUT MNCRL AB 4-0 PS2 18 (SUTURE) ×3 IMPLANT
TOWEL OR 17X26 10 PK STRL BLUE (TOWEL DISPOSABLE) ×3 IMPLANT
TRAY LAPAROSCOPIC (CUSTOM PROCEDURE TRAY) ×3 IMPLANT
TROCAR BLADELESS OPT 5 100 (ENDOMECHANICALS) ×3 IMPLANT
TROCAR XCEL NON-BLD 11X100MML (ENDOMECHANICALS) ×3 IMPLANT

## 2019-08-22 NOTE — Discharge Instructions (Signed)
[facility] ______[facility] LAPAROSCOPIC SURGERY: POST OP INSTRUCTIONS Always review your discharge instruction sheet given to you by the facility where your surgery was performed. IF YOU HAVE DISABILITY OR FAMILY LEAVE FORMS, YOU MUST BRING THEM TO THE OFFICE FOR PROCESSING.   DO NOT GIVE THEM TO YOUR DOCTOR.  1. A prescription for pain medication may be given to you upon discharge.  Take your pain medication as prescribed, if needed.  If narcotic pain medicine is not needed, then you may take acetaminophen (Tylenol) or ibuprofen (Advil) as needed. 2. Take your usually prescribed medications unless otherwise directed. 3. If you need a refill on your pain medication, please contact your pharmacy.  They will contact our office to request authorization. Prescriptions will not be filled after 5pm or on week-ends. 4. You should follow a light diet the first few days after arrival home, such as soup and crackers, etc.  Be sure to include lots of fluids daily. 5. Most patients will experience some swelling and bruising in the area of the incisions.  Ice packs will help.  Swelling and bruising can take several days to resolve.  6. It is common to experience some constipation if taking pain medication after surgery.  Increasing fluid intake and taking a stool softener (such as Colace) will usually help or prevent this problem from occurring.  A mild laxative (Milk of Magnesia or Miralax) should be taken according to package instructions if there are no bowel movements after 48 hours. 7. Unless discharge instructions indicate otherwise, you may remove your bandages 24-48 hours after surgery, and you may shower at that time.  You may have steri-strips (small skin tapes) in place directly over the incision.  These strips should be left on the skin for 7-10 days.  If your surgeon used skin glue on the incision, you may shower in 24 hours.  The glue will flake off over the next 2-3 weeks.  Any sutures or  staples will be removed at the office during your follow-up visit. 8. ACTIVITIES:  You may resume regular (light) daily activities beginning the next day--such as daily self-care, walking, climbing stairs--gradually increasing activities as tolerated.  You may have sexual intercourse when it is comfortable.  Refrain from any heavy lifting or straining until approved by your doctor. a. You may drive when you are no longer taking prescription pain medication, you can comfortably wear a seatbelt, and you can safely maneuver your car and apply brakes. b. RETURN TO WORK:  __________________________________________________________ 9. You should see your doctor in the office for a follow-up appointment approximately 2-3 weeks after your surgery.  Make sure that you call for this appointment within a day or two after you arrive home to insure a convenient appointment time. 10. OTHER INSTRUCTIONS: __________________________________________________________________________________________________________________________ __________________________________________________________________________________________________________________________ WHEN TO CALL YOUR DOCTOR: 1. Fever over 101.0 2. Inability to urinate 3. Continued bleeding from incision. 4. Increased pain, redness, or drainage from the incision. 5. Increasing abdominal pain  The clinic staff is available to answer your questions during regular business hours.  Please don't hesitate to call and ask to speak to one of the nurses for clinical concerns.  If you have a medical emergency, go to the nearest emergency room or call 911.  A surgeon from [facility] is always on call at the hospital. [address] [place], [place], [place]  [address] ? [address], [place], [place]   [address] [phone] ? [phone] ? FAX [phone] Web site:  [email or web address] Do not take any Tylenol or NSAIDS until after 3:00 pm  today.

## 2019-08-22 NOTE — H&P (Signed)
Olivia Sellers is an 29 y.o. female.   Chief Complaint: abdominal pain HPI: Pt is a 48 F with abdominal pain in the epigastrim and RUQ area.  Pt states this usually after eating a high fatty meal.  Pt was recently in the ED d/t pain.  She underwent labs and [place].  [place] was s/f gallstones. Pt was referred for surgical mgmt.  Past Medical History:  Diagnosis Date  . Anemia   . Anxiety   . Cholelithiasis   . Depression   . Pilonidal cyst    hx of    Past Surgical History:  Procedure Laterality Date  . SURGERY OF LIP     suture to lip at age 32  . wisdom teeth removed      Family History  Problem Relation Age of Onset  . Panic disorder Mother   . Drug abuse Mother   . Alcohol abuse Mother   . Depression Mother   . COPD Mother   . Bipolar disorder Father   . Alcohol abuse Father    Social History:  reports that she has never smoked. She has never used smokeless tobacco. She reports current alcohol use. She reports that she does not use drugs.  Allergies: No Known Allergies  Medications Prior to Admission  Medication Sig Dispense Refill  . norgestimate-ethinyl estradiol (SPRINTEC 28) 0.25-35 MG-MCG tablet Take 1 tablet by mouth daily. (Patient taking differently: Take 1 tablet by mouth at bedtime. ) 1 Package 3    No results found for this or any previous visit (from the past 48 hour(s)). No results found.  Review of Systems  Constitutional: Negative for chills and fever.  HENT: Negative for ear discharge, hearing loss and sore throat.   Eyes: Negative for discharge.  Respiratory: Negative for cough and shortness of breath.   Cardiovascular: Negative for chest pain and leg swelling.  Gastrointestinal: Positive for abdominal pain and nausea. Negative for constipation, diarrhea and vomiting.  Musculoskeletal: Negative for myalgias and neck pain.  Skin: Negative for rash.  Allergic/Immunologic: Negative for environmental allergies.  Neurological: Negative for dizziness and  seizures.  Hematological: Does not bruise/bleed easily.  Psychiatric/Behavioral: Negative for suicidal ideas.  All other systems reviewed and are negative.   Blood pressure 113/73, pulse 88, temperature 98 F (36.7 C), temperature source Oral, resp. rate 14, height 5' 7.5" (1.715 m), weight 58.4 kg, last menstrual period 08/12/2019, SpO2 100 %. Physical Exam  Constitutional: She is oriented to person, place, and time. Vital signs are normal. She appears well-developed and well-nourished.  Conversant No acute distress  Eyes: Lids are normal. No scleral icterus.  No lid lag Moist conjunctiva  Neck: No tracheal tenderness present. No thyromegaly present.  No cervical lymphadenopathy  Cardiovascular: Normal rate, regular rhythm and intact distal pulses.  No murmur heard. Respiratory: Effort normal and breath sounds normal. She has no wheezes. She has no rales.  GI: Soft. Bowel sounds are normal. She exhibits no distension. There is no hepatosplenomegaly. There is abdominal tenderness (RUQ on deep palp). There is no rebound. No hernia.  Neurological: She is alert and oriented to person, place, and time.  Normal gait and station  Skin: Skin is warm. No rash noted. No cyanosis. Nails show no clubbing.  Normal skin turgor  Psychiatric: Judgment normal.  Appropriate affect     Assessment/Plan 73 F with sx gallstones. To OR for lap chole All risks and benefits were discussed with the patient to generally include: infection, bleeding, possible need for  post op ERCP, damage to the bile ducts, and bile leak. Alternatives were offered and described.  All questions were answered and the patient voiced understanding of the procedure and wishes to proceed at this point with a laparoscopic cholecystectomy   Ralene Ok, MD 08/22/2019, 9:41 AM

## 2019-08-22 NOTE — Transfer of Care (Signed)
Immediate Anesthesia Transfer of Care Note  Patient: Olivia Sellers  Procedure(s) Performed: LAPAROSCOPIC CHOLECYSTECTOMY (N/A Abdomen)  Patient Location: PACU  Anesthesia Type:General  Level of Consciousness: awake, alert  and oriented  Airway & Oxygen Therapy: Patient Spontanous Breathing and Patient connected to nasal cannula oxygen  Post-op Assessment: Report given to RN and Post -op Vital signs reviewed and stable  Post vital signs: Reviewed and stable  Last Vitals:  Vitals Value Taken Time  BP 113/77 08/22/19 1110  Temp    Pulse 94 08/22/19 1112  Resp 11 08/22/19 1112  SpO2 100 % 08/22/19 1112  Vitals shown include unvalidated device data.  Last Pain:  Vitals:   08/22/19 0847  TempSrc: Oral  PainSc: 0-No pain      Patients Stated Pain Goal: 3 (08/22/19 0847)  Complications: No apparent anesthesia complications

## 2019-08-22 NOTE — Anesthesia Procedure Notes (Signed)
Procedure Name: Intubation Date/Time: 08/22/2019 10:03 AM Performed by: Kathyann Spaugh D, CRNA Pre-anesthesia Checklist: Patient identified, Emergency Drugs available, Suction available and Patient being monitored Patient Re-evaluated:Patient Re-evaluated prior to induction Oxygen Delivery Method: Circle system utilized Preoxygenation: Pre-oxygenation with 100% oxygen Induction Type: IV induction Ventilation: Mask ventilation without difficulty Laryngoscope Size: Mac and 3 Grade View: Grade I Tube type: Oral Tube size: 7.0 mm Number of attempts: 1 Airway Equipment and Method: Stylet Placement Confirmation: ETT inserted through vocal cords under direct vision,  positive ETCO2 and breath sounds checked- equal and bilateral Secured at: 21 cm Tube secured with: Tape Dental Injury: Teeth and Oropharynx as per pre-operative assessment

## 2019-08-22 NOTE — Op Note (Signed)
08/22/2019  11:00 AM  PATIENT:  Olivia Sellers  29 y.o. female  PRE-OPERATIVE DIAGNOSIS:  GALLSTONES  POST-OPERATIVE DIAGNOSIS:  GALLSTONES  PROCEDURE:  Procedure(s): LAPAROSCOPIC CHOLECYSTECTOMY (N/A)  SURGEON:  Surgeon(s) and Role:    Axel Filler, MD - Primary  ANESTHESIA:   local and general  EBL:  10 mL   BLOOD ADMINISTERED:none  DRAINS: none   LOCAL MEDICATIONS USED:  BUPIVICAINE   SPECIMEN:  Source of Specimen:  gallbladder  DISPOSITION OF SPECIMEN:  PATHOLOGY  COUNTS:  YES  TOURNIQUET:  * No tourniquets in log *  DICTATION: .Dragon Dictation  EBL: <5cc   Complications: none   Counts: reported as correct x 2   Findings:chronically inflamed gallbladder  Indications for procedure: Pt is a 83F with RUQ pain and seen to have gallstones and had pain.  Details of the procedure: The patient was taken to the operating and placed in the supine position with bilateral SCDs in place. A time out was called and all facts were verified. A pneumoperitoneum was obtained via A Veress needle technique to a pressure of 22mm of mercury. A 55mm trochar was then placed in the right upper quadrant under visualization, and there were no injuries to any abdominal organs. A 11 mm port was then placed in the umbilical region after infiltrating with local anesthesia under direct visualization. A second epigastric port was placed under direct visualization.   The gallbladder was identified and retracted, the peritoneum was then sharply dissected from the gallbladder and this dissection was carried down to Calot's triangle. The cystic duct was identified and dissected circumferentially and seen going into the gallbladder 360.  The cystic artery was dissected away from the surrounding tissues.   The critical angle was obtained.    2 clips were placed proximally one distally and the cystic duct transected. The cystic artery was identified and 2 clips placed proximally and one distally  and transected. We then proceeded to remove the gallbladder off the hepatic fossa with Bovie cautery. A retrieval bag was then placed in the abdomen and gallbladder placed in the bag. The hepatic fossa was then reexamined and hemostasis was achieved with Bovie cautery and was excellent at this portion of the case. The subhepatic fossa and perihepatic fossa was then irrigated until the effluent was clear. The specimen bag and specimen were removed from the abdominal cavity.  The 11 mm trocar fascia was reapproximated with the PMI and #1 Vicryl x2. The pneumoperitoneum was evacuated and all trochars removed under direct visulalization. The skin was then closed with 4-0 Monocryl and the skin dressed with Dermabond. The patient was awaken from general anesthesia and taken to the recovery room in stable condition.    PLAN OF CARE: Discharge to home after PACU  PATIENT DISPOSITION:  PACU - hemodynamically stable.   Delay start of Pharmacological VTE agent (>24hrs) due to surgical blood loss or risk of bleeding: not applicable

## 2019-08-22 NOTE — Anesthesia Postprocedure Evaluation (Signed)
Anesthesia Post Note  Patient: Olivia Sellers  Procedure(s) Performed: LAPAROSCOPIC CHOLECYSTECTOMY (N/A Abdomen)     Patient location during evaluation: PACU Anesthesia Type: General Level of consciousness: awake and alert and oriented Pain management: pain level controlled Vital Signs Assessment: post-procedure vital signs reviewed and stable Respiratory status: spontaneous breathing, nonlabored ventilation and respiratory function stable Cardiovascular status: blood pressure returned to baseline Postop Assessment: no apparent nausea or vomiting Anesthetic complications: no    Last Vitals:  Vitals:   08/22/19 1215 08/22/19 1230  BP: 108/74 106/78  Pulse: 64 67  Resp: 12 14  Temp:  37.1 C  SpO2: 99% 100%    Last Pain:  Vitals:   08/22/19 1210  TempSrc:   PainSc: 6                  Kaylyn Layer

## 2019-08-25 LAB — CYTOLOGY - PAP
Comment: NEGATIVE
Diagnosis: NEGATIVE
High risk HPV: NEGATIVE

## 2019-08-25 LAB — SURGICAL PATHOLOGY

## 2019-09-09 ENCOUNTER — Encounter: Payer: Self-pay | Admitting: Adult Health

## 2019-09-09 ENCOUNTER — Ambulatory Visit (INDEPENDENT_AMBULATORY_CARE_PROVIDER_SITE_OTHER): Payer: No Typology Code available for payment source | Admitting: Adult Health

## 2019-09-09 ENCOUNTER — Other Ambulatory Visit: Payer: Self-pay

## 2019-09-09 VITALS — BP 121/82 | HR 113 | Ht 67.5 in | Wt 131.0 lb

## 2019-09-09 DIAGNOSIS — Z30017 Encounter for initial prescription of implantable subdermal contraceptive: Secondary | ICD-10-CM | POA: Diagnosis not present

## 2019-09-09 DIAGNOSIS — Z3202 Encounter for pregnancy test, result negative: Secondary | ICD-10-CM | POA: Diagnosis not present

## 2019-09-09 HISTORY — DX: Encounter for initial prescription of implantable subdermal contraceptive: Z30.017

## 2019-09-09 LAB — POCT URINE PREGNANCY: Preg Test, Ur: NEGATIVE

## 2019-09-09 MED ORDER — ETONOGESTREL 68 MG ~~LOC~~ IMPL
68.0000 mg | DRUG_IMPLANT | Freq: Once | SUBCUTANEOUS | Status: AC
  Administered 2019-09-09: 10:00:00 68 mg via SUBCUTANEOUS

## 2019-09-09 NOTE — Progress Notes (Addendum)
  Subjective:     Patient ID: Olivia Sellers, female   DOB: 03/16/1991, 29 y.o.   MRN: [id number]  HPI Damonique is a 29 year old white female, single, G0P0 in for nexplanon insertion. She had her gallbladder removed 08/22/19 and is doing well. PCP is Tereasa Coop NP.   Review of Systems For nexplanon insertion Reviewed past medical,surgical, social and family history. Reviewed medications and allergies.     Objective:   Physical Exam BP 121/82 (BP Location: Left Arm, Patient Position: Sitting, Cuff Size: Normal)   Pulse (!) 113   Ht 5' 7.5" (1.715 m)   Wt 131 lb (59.4 kg)   LMP 08/12/2019 (Exact Date)   BMI 20.21 kg/m   UPT is negative. Consent signed, time out called. Left arm cleansed with betadine, and injected with 1.5 cc 1% lidocaine and waited til numb. Nexplanon easily inserted and steri strips applied.Rod easily palpated by provider and pt. Pressure dressing applied.Can stop OCs now.     Assessment:     1. Urine pregnancy test negative  2. Nexplanon insertion Lot# [id number]    Plan:     Use condoms x 2 weeks, keep clean and dry x 24 hours, no heavy lifting, keep steri strips on x 72 hours, Keep pressure dressing on x 24 hours. Follow up prn problems. Remove nexplanon in 3 years or sooner if desired

## 2019-09-09 NOTE — Patient Instructions (Signed)
Use condoms x 2 weeks, keep clean and dry x 24 hours, no heavy lifting, keep steri strips on x 72 hours, Keep pressure dressing on x 24 hours. Follow up prn problems.  

## 2019-09-23 ENCOUNTER — Other Ambulatory Visit: Payer: Self-pay

## 2019-09-23 ENCOUNTER — Encounter: Payer: Self-pay | Admitting: Family Medicine

## 2019-09-23 ENCOUNTER — Ambulatory Visit (INDEPENDENT_AMBULATORY_CARE_PROVIDER_SITE_OTHER): Payer: No Typology Code available for payment source | Admitting: Family Medicine

## 2019-09-23 VITALS — BP 104/72 | HR 97 | Temp 97.5°F | Resp 15 | Ht 67.5 in | Wt 130.1 lb

## 2019-09-23 DIAGNOSIS — F331 Major depressive disorder, recurrent, moderate: Secondary | ICD-10-CM | POA: Insufficient documentation

## 2019-09-23 DIAGNOSIS — F418 Other specified anxiety disorders: Secondary | ICD-10-CM

## 2019-09-23 DIAGNOSIS — F321 Major depressive disorder, single episode, moderate: Secondary | ICD-10-CM

## 2019-09-23 MED ORDER — ESCITALOPRAM OXALATE 10 MG PO TABS: 10.0000 mg | ORAL_TABLET | Freq: Every day | ORAL | 0 refills | Status: DC

## 2019-09-23 NOTE — Assessment & Plan Note (Addendum)
Elevated GAD denies having any SI or HI.  Will be starting Lexapro and referral to behavioral health.  Months follow-up to make sure that she is doing okay and tolerating Lexapro. Education on acknowledging emotions but not letting them drive the focus of her self.  Encouraged her to get back to doing yoga.   Reviewed side effects, risks and benefits of medication.   Patient acknowledged agreement and understanding of the plan.

## 2019-09-23 NOTE — Patient Instructions (Addendum)
May you have a year filled with hope, love, happiness and laughter.  I appreciate the opportunity to provide you with care for your health and wellness. Today we discussed: anxiety and depression   Follow up: 3.5 weeks   No labs or referrals today  Acknowledge your emotions, focus on not responding.  Number one rule is self care-your oxygen mask before another's.  Get back to yoga daily even if for 5 minutes.  Please continue to practice social distancing to keep you, your family, and our community safe.  If you must go out, please wear a mask and practice good handwashing.  It was a pleasure to see you and I look forward to continuing to work together on your health and well-being. Please do not hesitate to call the office if you need care or have questions about your care.  Have a wonderful day and week. With Gratitude, Tereasa Coop, DNP, AGNP-BC

## 2019-09-23 NOTE — Progress Notes (Signed)
Subjective:  Patient ID: Olivia Sellers, female    DOB: May 22, 1991  Age: 29 y.o. MRN: [id number]  CC:  Chief Complaint  Patient presents with  . Anxiety    follow up      HPI  HPI Olivia Sellers is a 29 year old female patient of mine.  Presents today with worsening anxiety and depression.  Most likely situational based as Olivia Sellers is trying to help her sister who is also taking care of their mother get into a better living situation.  Olivia Sellers reports that Olivia Sellers is unable to go home and spend time with her mother for long periods of time secondary to the stress that her mother has impacted on her over the years growing up.  Olivia Sellers reports that Olivia Sellers wants her sister to be able to get out of the home as well but is worried that Olivia Sellers is unable to do that at this time secondary to her personality.  Olivia Sellers just wants everybody to be okay but Olivia Sellers knows that Olivia Sellers can only handle so much of the situation.  Olivia Sellers reports that her sister and her relationship has become strained which is very bothersome to her as Olivia Sellers loves her sister very generally.  Olivia Sellers does loving care about her mother but the generalization gathered from conversation is that current living conditions are not very feasible or advisable and it would be better for her to live in a smaller area between both the daughter so that they can each share responsibility and helping keep care for her.  Without actually living with her.  Olivia Sellers reports that Olivia Sellers is open to therapy and would like to start a medication at this time as Olivia Sellers reports her significant other has noticed a change in her anxiety level recently.  Today patient denies signs and symptoms of COVID 19 infection including fever, chills, cough, shortness of breath, and headache. Past Medical, Surgical, Social History, Allergies, and Medications have been Reviewed.   Past Medical History:  Diagnosis Date  . Anemia   . Anxiety   . Cholelithiasis   . Depression   . Encounter for gynecological examination  with Papanicolaou smear of cervix 08/20/2019  . General counseling and advice for contraceptive management 08/20/2019  . Nexplanon insertion 09/09/2019  . Pilonidal cyst    hx of    No outpatient medications have been marked as taking for the 09/23/19 encounter (Office Visit) with Perlie Mayo, NP.    ROS:  Review of Systems  Constitutional: Negative.   HENT: Negative.   Eyes: Negative.   Respiratory: Negative.   Cardiovascular: Negative.   Gastrointestinal: Negative.   Genitourinary: Negative.   Musculoskeletal: Negative.   Skin: Negative.   Neurological: Negative.   Endo/Heme/Allergies: Negative.   Psychiatric/Behavioral: Positive for depression. The patient is nervous/anxious.   All other systems reviewed and are negative.    Objective:   Today's Vitals: BP 104/72   Pulse 97   Temp (!) 97.5 F (36.4 C) (Temporal)   Resp 15   Ht 5' 7.5" (1.715 m)   Wt 130 lb 1.3 oz (59 kg)   SpO2 98%   BMI 20.07 kg/m  Vitals with BMI 09/23/2019 09/09/2019 08/22/2019  Height 5' 7.5" 5' 7.5" -  Weight 130 lbs 1 oz 131 lbs -  BMI 81.85 63.1 -  Systolic 497 026 378  Diastolic 72 82 71  Pulse 97 113 73     Physical Exam Vitals and nursing note reviewed.  Constitutional:  Appearance: Normal appearance. Olivia Sellers is well-developed and well-groomed. Olivia Sellers is obese.  HENT:     Head: Normocephalic and atraumatic.     Right Ear: External ear normal.     Left Ear: External ear normal.     Mouth/Throat:     Comments: Mask in place  Eyes:     General:        Right eye: No discharge.        Left eye: No discharge.     Conjunctiva/sclera: Conjunctivae normal.  Cardiovascular:     Rate and Rhythm: Normal rate and regular rhythm.     Pulses: Normal pulses.     Heart sounds: Normal heart sounds.  Pulmonary:     Effort: Pulmonary effort is normal.     Breath sounds: Normal breath sounds.  Musculoskeletal:        General: Normal range of motion.     Cervical back: Normal range of motion  and neck supple.  Skin:    General: Skin is warm.  Neurological:     General: No focal deficit present.     Mental Status: Olivia Sellers is alert and oriented to person, place, and time.  Psychiatric:        Attention and Perception: Attention and perception normal.        Mood and Affect: Mood is anxious. Affect is tearful.        Speech: Speech normal.        Behavior: Behavior normal. Behavior is cooperative.        Thought Content: Thought content normal.        Cognition and Memory: Cognition and memory normal.        Judgment: Judgment normal.     GAD 7 : Generalized Anxiety Score 09/23/2019  Nervous, Anxious, on Edge 3  Control/stop worrying 3  Worry too much - different things 3  Trouble relaxing 3  Restless 1  Easily annoyed or irritable 3  Afraid - awful might happen 3  Total GAD 7 Score 19  Anxiety Difficulty Extremely difficult    Depression screen [facility] 2/9 09/23/2019 08/20/2019 06/18/2019  Decreased Interest 3 0 0  Down, Depressed, Hopeless 3 0 0  PHQ - 2 Score 6 0 0  Altered sleeping 1 - -  Tired, decreased energy 3 - -  Change in appetite 3 - -  Feeling bad or failure about yourself  1 - -  Trouble concentrating 3 - -  Moving slowly or fidgety/restless 1 - -  Suicidal thoughts 0 - -  PHQ-9 Score 18 - -     Assessment   1. Depression, major, single episode, moderate (HCC)   2. Situational anxiety     Tests ordered Orders Placed This Encounter  Procedures  . Ambulatory referral to Behavioral Health     Plan: Please see assessment and plan per problem list above.   Meds ordered this encounter  Medications  . escitalopram (LEXAPRO) 10 MG tablet    Sig: Take 1 tablet (10 mg total) by mouth daily.    Dispense:  30 tablet    Refill:  0    Order Specific Question:   Supervising Provider    Answer:   Genia Harold    Patient to follow-up in 10/22/2019   Freddy Finner, NP

## 2019-09-23 NOTE — Assessment & Plan Note (Addendum)
PHQ is elevated at 18.  She reports that she does not have any SI or HI.  Will be starting Lexapro and referral to behavioral health.  Strong recommendations on doing self-care, including yoga and the like.  And acknowledgment of emotions without letting them drive her.   Reviewed side effects, risks and benefits of medication.   Patient acknowledged agreement and understanding of the plan.

## 2019-10-22 ENCOUNTER — Encounter: Payer: Self-pay | Admitting: Family Medicine

## 2019-10-22 ENCOUNTER — Ambulatory Visit (INDEPENDENT_AMBULATORY_CARE_PROVIDER_SITE_OTHER): Payer: No Typology Code available for payment source | Admitting: Family Medicine

## 2019-10-22 ENCOUNTER — Other Ambulatory Visit: Payer: Self-pay

## 2019-10-22 DIAGNOSIS — F418 Other specified anxiety disorders: Secondary | ICD-10-CM

## 2019-10-22 MED ORDER — ESCITALOPRAM OXALATE 10 MG PO TABS: 10.0000 mg | ORAL_TABLET | Freq: Every day | ORAL | 0 refills | Status: DC

## 2019-10-22 NOTE — Patient Instructions (Signed)
I appreciate the opportunity to provide you with care for your health and wellness. Today we discussed: anxiety  Follow up: 3 months   No labs or referrals today  SO GLAD YOU ARE FEELING BETTER :) Keep focus on your self care.  Please continue to practice social distancing to keep you, your family, and our community safe.  If you must go out, please wear a mask and practice good handwashing.  It was a pleasure to see you and I look forward to continuing to work together on your health and well-being. Please do not hesitate to call the office if you need care or have questions about your care.  Have a wonderful day and week. With Gratitude, Tereasa Coop, DNP, AGNP-BC

## 2019-10-22 NOTE — Progress Notes (Signed)
Virtual Visit via Telephone Note   This visit type was conducted due to national recommendations for restrictions regarding the COVID-19 Pandemic (e.g. social distancing) in an effort to limit this patient's exposure and mitigate transmission in our community.  Due to her co-morbid illnesses, this patient is at least at moderate risk for complications without adequate follow up.  This format is felt to be most appropriate for this patient at this time.  The patient did not have access to video technology/had technical difficulties with video requiring transitioning to audio format only (telephone).  All issues noted in this document were discussed and addressed.  No physical exam could be performed with this format.    Evaluation Performed:  Follow-up visit  Date:  10/22/2019   ID:  Olivia Sellers, DOB 1991/04/13, MRN [id number]  Patient Location: Home Provider Location: Office  Location of Patient: Home Location of Provider: Telehealth Consent was obtain for visit to be over via telehealth. I verified that I am speaking with the correct person using two identifiers.  PCP:  Perlie Mayo, NP   Chief Complaint: Anxiety  History of Present Illness:    Olivia Sellers is a 29 y.o. female with recent issues with anxiety and at times depression.  In February we started her on Lexapro and had a referral to behavioral health.  She reports today that she is doing much better the medication helps her so much that she is tolerating Lexapro.  She reports that she would like to continue this and may be transferred to something that as needed in the future when she gets everything under control.  She has been working on her self acknowledging emotions and not let him drive her and able to identify when she is feeling anxious.  And has been trying to do more self-care items and things.  The patient does not have symptoms concerning for COVID-19 infection (fever, chills, cough, or new shortness of  breath).   Past Medical, Surgical, Social History, Allergies, and Medications have been Reviewed.  Past Medical History:  Diagnosis Date  . Anemia   . Anxiety   . Cholelithiasis   . Depression   . Encounter for gynecological examination with Papanicolaou smear of cervix 08/20/2019  . General counseling and advice for contraceptive management 08/20/2019  . Nexplanon insertion 09/09/2019  . Pilonidal cyst    hx of   Past Surgical History:  Procedure Laterality Date  . CHOLECYSTECTOMY N/A 08/22/2019   Procedure: LAPAROSCOPIC CHOLECYSTECTOMY;  Surgeon: Ralene Ok, MD;  Location: [facility];  Service: General;  Laterality: N/A;  . SURGERY OF LIP     suture to lip at age 19  . wisdom teeth removed       Current Meds  Medication Sig  . escitalopram (LEXAPRO) 10 MG tablet Take 1 tablet (10 mg total) by mouth daily.     Allergies:   Patient has no known allergies.   ROS:   Please see the history of present illness.    All other systems reviewed and are negative.   Labs/Other Tests and Data Reviewed:    Recent Labs: 08/09/2019: ALT 22; BUN 12; Creatinine, Ser 0.68; Hemoglobin 12.7; Platelets 264; Potassium 3.4; Sodium 135   Recent Lipid Panel No results found for: CHOL, TRIG, HDL, CHOLHDL, LDLCALC, LDLDIRECT  Wt Readings from Last 3 Encounters:  10/22/19 130 lb (59 kg)  09/23/19 130 lb 1.3 oz (59 kg)  09/09/19 131 lb (59.4 kg)  Objective:    Vital Signs:  BP 104/72   Ht 5' 7.5" (1.715 m)   Wt 130 lb (59 kg)   BMI 20.06 kg/m    VITAL SIGNS:  reviewed GEN:  Alert and oriented RESPIRATORY:  No shortness of breath noted in conversation PSYCH:  Normal affect and mood  GAD 7 : Generalized Anxiety Score 10/22/2019 09/23/2019  Nervous, Anxious, on Edge 0 3  Control/stop worrying 0 3  Worry too much - different things 0 3  Trouble relaxing 0 3  Restless 0 1  Easily annoyed or irritable 0 3  Afraid - awful might happen 0 3  Total GAD 7 Score 0 19    Anxiety Difficulty Not difficult at all Extremely difficult    Depression screen [facility] 2/9 10/22/2019 09/23/2019 08/20/2019  Decreased Interest 0 3 0  Down, Depressed, Hopeless 0 3 0  PHQ - 2 Score 0 6 0  Altered sleeping 0 1 -  Tired, decreased energy 0 3 -  Change in appetite 0 3 -  Feeling bad or failure about yourself  0 1 -  Trouble concentrating 0 3 -  Moving slowly or fidgety/restless 0 1 -  Suicidal thoughts 0 0 -  PHQ-9 Score 0 18 -  Difficult doing work/chores Not difficult at all - -      ASSESSMENT & PLAN:    1. Situational anxiety   Time:   Today, I have spent 10 minutes with the patient with telehealth technology discussing the above problems.     Medication Adjustments/Labs and Tests Ordered: Current medicines are reviewed at length with the patient today.  Concerns regarding medicines are outlined above.   Tests Ordered: No orders of the defined types were placed in this encounter.   Medication Changes: No orders of the defined types were placed in this encounter.   Disposition:  Follow up 3 months  Signed, Freddy Finner, NP  10/22/2019 9:43 AM     [facility]

## 2019-10-22 NOTE — Assessment & Plan Note (Signed)
GAD is back to 0.  Denies having any SI or HI.  Continue Lexapro for 3 months.  Follow-up at that time to see if doing better and if we need to continue Lexapro versus starting BuSpar something as needed and wean her off the Lexapro. Reviewed side effects, risks and benefits of medication.   Patient acknowledged agreement and understanding of the plan.

## 2020-02-12 ENCOUNTER — Other Ambulatory Visit: Payer: Self-pay

## 2020-02-12 ENCOUNTER — Encounter: Payer: Self-pay | Admitting: Family Medicine

## 2020-02-12 ENCOUNTER — Telehealth (INDEPENDENT_AMBULATORY_CARE_PROVIDER_SITE_OTHER): Payer: No Typology Code available for payment source | Admitting: Family Medicine

## 2020-02-12 DIAGNOSIS — F418 Other specified anxiety disorders: Secondary | ICD-10-CM

## 2020-02-12 MED ORDER — ESCITALOPRAM OXALATE 10 MG PO TABS: 10.0000 mg | ORAL_TABLET | Freq: Every day | ORAL | 1 refills | Status: DC

## 2020-02-12 NOTE — Progress Notes (Signed)
Virtual Visit via Telephone Note   This visit type was conducted due to national recommendations for restrictions regarding the COVID-19 Pandemic (e.g. social distancing) in an effort to limit this patient's exposure and mitigate transmission in our community.  Due to her co-morbid illnesses, this patient is at least at moderate risk for complications without adequate follow up.  This format is felt to be most appropriate for this patient at this time.  The patient did not have access to video technology/had technical difficulties with video requiring transitioning to audio format only (telephone).  All issues noted in this document were discussed and addressed.  No physical exam could be performed with this format.    Evaluation Performed:  Follow-up visit  Date:  02/12/2020   ID:  Olivia Sellers, DOB 10-23-1990, MRN [id number]  Patient Location: Home Provider Location: Office/Clinic  Location of Patient: Home Location of Provider: Telehealth Consent was obtain for visit to be over via telehealth. I verified that I am speaking with the correct person using two identifiers.  PCP:  Freddy Finner, NP   Chief Complaint:   mood  History of Present Illness:    Olivia Sellers is a 29 y.o. female with history of anxiety and depression.  Reports doing very well on the Lexapro and would like to continue at this time.  Denies having any other issues or complaints or concerns to discuss today on the phone visit.  The patient does not have symptoms concerning for COVID-19 infection (fever, chills, cough, or new shortness of breath).   Past Medical, Surgical, Social History, Allergies, and Medications have been Reviewed.  Past Medical History:  Diagnosis Date  . Anemia   . Anxiety   . Cholelithiasis   . Depression   . Encounter for gynecological examination with Papanicolaou smear of cervix 08/20/2019  . General counseling and advice for contraceptive management 08/20/2019  . Nexplanon  insertion 09/09/2019  . Pilonidal cyst    hx of   Past Surgical History:  Procedure Laterality Date  . CHOLECYSTECTOMY N/A 08/22/2019   Procedure: LAPAROSCOPIC CHOLECYSTECTOMY;  Surgeon: Axel Filler, MD;  Location: [facility];  Service: General;  Laterality: N/A;  . SURGERY OF LIP     suture to lip at age 32  . wisdom teeth removed       Current Meds  Medication Sig  . escitalopram (LEXAPRO) 10 MG tablet Take 1 tablet (10 mg total) by mouth daily.     Allergies:   Patient has no known allergies.   ROS:   Please see the history of present illness.    All other systems reviewed and are negative.   Labs/Other Tests and Data Reviewed:    Recent Labs: 08/09/2019: ALT 22; BUN 12; Creatinine, Ser 0.68; Hemoglobin 12.7; Platelets 264; Potassium 3.4; Sodium 135   Recent Lipid Panel No results found for: CHOL, TRIG, HDL, CHOLHDL, LDLCALC, LDLDIRECT  Wt Readings from Last 3 Encounters:  02/12/20 130 lb (59 kg)  10/22/19 130 lb (59 kg)  09/23/19 130 lb 1.3 oz (59 kg)     Objective:    Vital Signs:  BP 104/72   Ht 5' 7.5" (1.715 m)   Wt 130 lb (59 kg)   BMI 20.06 kg/m    VITAL SIGNS:  reviewed GEN:  alert and oriented  RESPIRATORY:  no shortness of breath in conversation  PSYCH:  normal affect  Depression screen [facility] 2/9 02/12/2020 10/22/2019 09/23/2019 08/20/2019 06/18/2019  Decreased Interest 0 0  3 0 0  Down, Depressed, Hopeless 0 0 3 0 0  PHQ - 2 Score 0 0 6 0 0  Altered sleeping 0 0 1 - -  Tired, decreased energy 1 0 3 - -  Change in appetite 0 0 3 - -  Feeling bad or failure about yourself  0 0 1 - -  Trouble concentrating 0 0 3 - -  Moving slowly or fidgety/restless 0 0 1 - -  Suicidal thoughts 0 0 0 - -  PHQ-9 Score 1 0 18 - -  Difficult doing work/chores Not difficult at all Not difficult at all - - -   GAD 7 : Generalized Anxiety Score 02/12/2020 10/22/2019 09/23/2019  Nervous, Anxious, on Edge 0 0 3  Control/stop worrying 0 0 3  Worry too much  - different things 0 0 3  Trouble relaxing 0 0 3  Restless 0 0 1  Easily annoyed or irritable 0 0 3  Afraid - awful might happen 0 0 3  Total GAD 7 Score 0 0 19  Anxiety Difficulty Not difficult at all Not difficult at all Extremely difficult     ASSESSMENT & PLAN:    1. Situational anxiety  - escitalopram (LEXAPRO) 10 MG tablet; Take 1 tablet (10 mg total) by mouth daily.  Dispense: 90 tablet; Refill: 1    Time:   Today, I have spent 10 minutes with the patient with telehealth technology discussing the above problems.     Medication Adjustments/Labs and Tests Ordered: Current medicines are reviewed at length with the patient today.  Concerns regarding medicines are outlined above.   Tests Ordered: No orders of the defined types were placed in this encounter.   Medication Changes: No orders of the defined types were placed in this encounter.   Disposition:  Follow up  06/18/2020  Signed, Freddy Finner, NP  02/12/2020 10:29 AM     [facility]

## 2020-02-12 NOTE — Patient Instructions (Addendum)
I appreciate the opportunity to provide you with care for your health and wellness. Today we discussed: mood   Follow up: as scheduled  No labs or referrals today  Glad you are feeling better. Continue medication, if you need me for anything, please just reach out.   Please continue to practice social distancing to keep you, your family, and our community safe.  If you must go out, please wear a mask and practice good handwashing.  It was a pleasure to see you and I look forward to continuing to work together on your health and well-being. Please do not hesitate to call the office if you need care or have questions about your care.  Have a wonderful day and week. With Gratitude, Tereasa Coop, DNP, AGNP-BC

## 2020-02-19 ENCOUNTER — Encounter: Payer: Self-pay | Admitting: Adult Health

## 2020-02-19 ENCOUNTER — Ambulatory Visit: Payer: No Typology Code available for payment source | Admitting: Adult Health

## 2020-02-19 ENCOUNTER — Other Ambulatory Visit: Payer: Self-pay | Admitting: Adult Health

## 2020-02-19 ENCOUNTER — Encounter: Payer: No Typology Code available for payment source | Admitting: Adult Health

## 2020-02-19 VITALS — BP 112/72 | HR 82 | Ht 67.5 in | Wt 140.0 lb

## 2020-02-19 DIAGNOSIS — Z30011 Encounter for initial prescription of contraceptive pills: Secondary | ICD-10-CM

## 2020-02-19 DIAGNOSIS — Z3046 Encounter for surveillance of implantable subdermal contraceptive: Secondary | ICD-10-CM

## 2020-02-19 DIAGNOSIS — Z3202 Encounter for pregnancy test, result negative: Secondary | ICD-10-CM | POA: Diagnosis not present

## 2020-02-19 HISTORY — DX: Encounter for initial prescription of contraceptive pills: Z30.011

## 2020-02-19 HISTORY — DX: Encounter for surveillance of implantable subdermal contraceptive: Z30.46

## 2020-02-19 LAB — POCT URINE PREGNANCY: Preg Test, Ur: NEGATIVE

## 2020-02-19 MED ORDER — NORETHIN ACE-ETH ESTRAD-FE 1-20 MG-MCG PO TABS: 1.0000 | ORAL_TABLET | Freq: Every day | ORAL | 3 refills | Status: DC

## 2020-02-19 NOTE — Patient Instructions (Signed)
Use condoms x 4 weeks, keep clean and dry x 24 hours, no heavy lifting, keep steri strips on x 72 hours, Keep pressure dressing on x 24 hours. Follow up prn problems.  Start OCs today  

## 2020-02-19 NOTE — Progress Notes (Signed)
  Subjective:     Patient ID: Olivia Sellers, female   DOB: 11-Dec-1990, 29 y.o.   MRN: [id number]  HPI Olivia Sellers is a 29 year old white female, G0P) in for nexplanon removal and wants to get back on COs but not sprintec.  PCP is Tereasa Coop NP.  Review of Systems For nexplanon removal Had PMS and irregular bleeding     Reviewed past medical,surgical, social and family history. Reviewed medications and allergies.  Objective:   Physical Exam BP 112/72 (BP Location: Left Arm, Patient Position: Sitting, Cuff Size: Normal)   Pulse 82   Ht 5' 7.5" (1.715 m)   Wt 140 lb (63.5 kg)   LMP 02/12/2020 (Exact Date)   BMI 21.60 kg/m UPT is negative  Consent signed.time out called. Left arm cleansed with betadine, and injected with 1.5 cc 2% lidocaine and waited til numb.Under sterile technique a #11 blade was used to make small vertical incision, and a curved forceps was used to easily remove rod. Steri strips applied. Pressure dressing applied.     Upstream - 02/19/20 1544      Pregnancy Intention Screening   Does the patient want to become pregnant in the next year? No    Does the patient's partner want to become pregnant in the next year? No    Would the patient like to discuss contraceptive options today? No      Contraception Wrap Up   Current Method Hormonal Implant    End Method Oral Contraceptive    Contraception Counseling Provided Yes          Assessment:     1. Urine pregnancy test negative   2. Encounter for Nexplanon removal Use condoms x 4 weeks, keep clean and dry x 24 hours, no heavy lifting, keep steri strips on x 72 hours, Keep pressure dressing on x 24 hours. Follow up prn problems.  3. Encounter for initial prescription of contraceptive pills Will try low dose pill  Start Junel 1/20 today Meds ordered this encounter  Medications  . norethindrone-ethinyl estradiol (LOESTRIN FE) 1-20 MG-MCG tablet    Sig: Take 1 tablet by mouth daily.    Dispense:  84 tablet     Refill:  3    Order Specific Question:   Supervising Provider    Answer:   Lazaro Arms [[id number]]       Plan:     Call with any concerns  Follow up prn

## 2020-02-20 NOTE — Progress Notes (Signed)
This encounter was created in error - please disregard.

## 2020-03-19 ENCOUNTER — Other Ambulatory Visit (HOSPITAL_COMMUNITY): Payer: Self-pay | Admitting: Family Medicine

## 2020-03-19 MED FILL — BLISOVI FE 1/20 1-20 MG-MCG: 1-20 | 84 days supply | Qty: 84 | Fill #0

## 2020-05-31 MED FILL — ESCITALOPRAM 10 MG TABLET: 10 | 90 days supply | Qty: 90 | Fill #0

## 2020-06-18 ENCOUNTER — Other Ambulatory Visit: Payer: Self-pay | Admitting: Family Medicine

## 2020-06-18 ENCOUNTER — Other Ambulatory Visit: Payer: Self-pay

## 2020-06-18 ENCOUNTER — Encounter: Payer: Self-pay | Admitting: Family Medicine

## 2020-06-18 ENCOUNTER — Ambulatory Visit (INDEPENDENT_AMBULATORY_CARE_PROVIDER_SITE_OTHER): Payer: No Typology Code available for payment source | Admitting: Family Medicine

## 2020-06-18 VITALS — BP 108/62 | HR 74 | Temp 97.4°F | Ht 67.5 in | Wt 147.0 lb

## 2020-06-18 DIAGNOSIS — F321 Major depressive disorder, single episode, moderate: Secondary | ICD-10-CM

## 2020-06-18 DIAGNOSIS — G4726 Circadian rhythm sleep disorder, shift work type: Secondary | ICD-10-CM

## 2020-06-18 DIAGNOSIS — F418 Other specified anxiety disorders: Secondary | ICD-10-CM

## 2020-06-18 DIAGNOSIS — R5383 Other fatigue: Secondary | ICD-10-CM | POA: Diagnosis not present

## 2020-06-18 DIAGNOSIS — R7989 Other specified abnormal findings of blood chemistry: Secondary | ICD-10-CM

## 2020-06-18 DIAGNOSIS — Z0001 Encounter for general adult medical examination with abnormal findings: Secondary | ICD-10-CM

## 2020-06-18 DIAGNOSIS — N926 Irregular menstruation, unspecified: Secondary | ICD-10-CM

## 2020-06-18 MED ORDER — ESCITALOPRAM OXALATE 20 MG PO TABS: 20.0000 mg | ORAL_TABLET | Freq: Every day | ORAL | 1 refills | Status: DC

## 2020-06-18 MED FILL — BLISOVI FE 1/20 1-20 MG-MCG: 1-20 | 84 days supply | Qty: 84 | Fill #1

## 2020-06-18 NOTE — Assessment & Plan Note (Signed)
Updated labs ordered, will treat if needed

## 2020-06-18 NOTE — Assessment & Plan Note (Signed)
PHQ 7 increase lexapro as the Holiday season is hard on her. She denies SI or HI. Encouraged self care.  Reviewed side effects, risks and benefits of medication.   Patient acknowledged agreement and understanding of the plan.

## 2020-06-18 NOTE — Assessment & Plan Note (Signed)
GAD is elevated at 17- holiday related per her. Denies SI or HI. Increase Lexapro, encouraged self care  Patient acknowledged agreement and understanding of the plan.   Reviewed side effects, risks and benefits of medication.

## 2020-06-18 NOTE — Assessment & Plan Note (Signed)
Discussed monthly self breast exams and yearly mammograms; at least 30 minutes of aerobic activity at least 5 days/week and weight-bearing exercise 2x/week; proper sunscreen use reviewed; healthy diet, including goals of calcium and vitamin D intake and alcohol recommendations (less than or equal to 1 drink/day) reviewed; regular seatbelt use; changing batteries in smoke detectors.  Immunization recommendations discussed.  Colonoscopy recommendations reviewed.  

## 2020-06-18 NOTE — Assessment & Plan Note (Signed)
Missed cycle + HPT On BC Had some bleeding and cramping thought was cycle Would like blood test today

## 2020-06-18 NOTE — Assessment & Plan Note (Signed)
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

## 2020-06-18 NOTE — Progress Notes (Addendum)
Health Maintenance reviewed -   Immunization History  Administered Date(s) Administered  . Influenza,inj,Quad PF,6+ Mos 03/31/2020   Last Pap smear: 08/2019 Last mammogram: n/a  Last colonoscopy: n/a Last DEXA: n/a Dentist: Nov 2021 Ophtho: Glasses- April 2021  Exercise: Run/Walking dog daily 60 mins  Smoker: no  Alcohol Use: socially   Other doctors caring for patient include:  Patient Care Team: Freddy Finner, NP as PCP - General (Family Medicine)  End of Life Discussion:  Patient does not have a living will and medical power of attorney  Subjective:   HPI  Olivia Sellers is a 29 y.o. female who presents for annual comprehensive visit and follow-up on chronic medical conditions.  She has the following concerns: She reports that she missed her cycle.  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  Days later she had some bleeding and cramping.  She took this for her.  Continued her birth control and presents today for questionable pregnancy as well as annual visit.  She reports having increased depression and anxiety mostly anxiety related to the holidays.  Is willing to have an increase in her medication.  Overall she is doing well she has been able to put on some weight.  She is up-to-date on all of her health maintenance items at this time.  She is looking to get back into school to get her BSN.  Review Of Systems  Review of Systems  Constitutional: Negative.   HENT: Negative.   Eyes: Negative.   Respiratory: Negative.   Cardiovascular: Positive for palpitations.       Anxiety related    Gastrointestinal: Negative.   Endocrine: Negative.   Genitourinary: Negative.   Musculoskeletal: Negative.   Skin: Negative.   Allergic/Immunologic: Negative.   Neurological: Negative.   Hematological: Negative.   Psychiatric/Behavioral: Negative.     Objective:    PHYSICAL EXAM:  BP 108/62 (BP Location: Left Arm, Patient Position: Sitting, Cuff Size: Normal)   Pulse 74   Temp (!) 97.4 F (36.3 C) (Temporal)   Ht 5' 7.5" (1.715 m)   Wt 147 lb (66.7 kg)   LMP 06/04/2020   SpO2 100%   BMI 22.68 kg/m   Physical Exam Vitals and nursing note reviewed.  Constitutional:      Appearance: Normal appearance. She is well-developed, well-groomed and normal weight.  HENT:     Head: Normocephalic.     Right Ear: Hearing, tympanic membrane, ear canal and external ear normal.     Left Ear: Hearing, tympanic membrane, ear canal and external ear normal.     Nose: Nose normal.     Mouth/Throat:     Lips: Pink.     Mouth: Mucous membranes are moist.     Pharynx: Oropharynx is clear. Uvula midline.  Eyes:     General: Lids are normal.     Extraocular Movements: Extraocular movements intact.     Conjunctiva/sclera: Conjunctivae normal.     Pupils: Pupils are equal, round, and reactive to light.  Neck:     Thyroid: No thyroid mass, thyromegaly or thyroid tenderness.     Vascular: No carotid bruit.  Cardiovascular:     Rate and Rhythm: Normal rate and regular rhythm.     Pulses: Normal pulses.          Radial pulses are 2+ on the right side and 2+ on the left  side.       Dorsalis pedis pulses are 2+ on the right side and 2+ on the left side.     Heart sounds: Normal heart sounds.  Pulmonary:     Effort: Pulmonary effort is normal.     Breath sounds: Normal breath sounds.  Abdominal:     General: Abdomen is flat. Bowel sounds are normal.     Palpations: Abdomen is soft.     Tenderness: There is no abdominal tenderness. There is no right CVA tenderness or left CVA tenderness.     Hernia: No hernia is present.  Musculoskeletal:        General: Normal range of motion.     Cervical back: Full passive range of motion without pain, normal range of motion and neck supple.     Right lower leg: No edema.     Left lower leg: No edema.     Comments: MAE,  ROM intact   Lymphadenopathy:     Cervical: No cervical adenopathy.  Skin:    General: Skin is warm and dry.     Capillary Refill: Capillary refill takes less than 2 seconds.  Neurological:     General: No focal deficit present.     Mental Status: She is alert and oriented to person, place, and time. Mental status is at baseline.     Cranial Nerves: Cranial nerves are intact.     Sensory: Sensation is intact.     Motor: Motor function is intact.     Coordination: Coordination is intact.     Gait: Gait is intact.     Deep Tendon Reflexes: Reflexes are normal and symmetric.  Psychiatric:        Attention and Perception: Attention and perception normal.        Mood and Affect: Mood and affect normal.        Speech: Speech normal.        Behavior: Behavior normal. Behavior is cooperative.        Thought Content: Thought content normal.        Cognition and Memory: Cognition and memory normal.        Judgment: Judgment normal.      Depression Screening  Depression screen [facility] 2/9 06/18/2020 06/18/2020 02/12/2020 10/22/2019 09/23/2019  Decreased Interest 1 1 0 0 3  Down, Depressed, Hopeless 1 1 0 0 3  PHQ - 2 Score 2 2 0 0 6  Altered sleeping 1 1 0 0 1  Tired, decreased energy 3 3 1  0 3  Change in appetite 0 0 0 0 3  Feeling bad or failure about yourself  0 0 0 0 1  Trouble concentrating 1 1 0 0 3  Moving slowly or fidgety/restless 0 0 0 0 1  Suicidal thoughts 0 0 0 0 0  PHQ-9 Score 7 7 1  0 18  Difficult doing work/chores Not difficult at all Not difficult at all Not difficult at all Not difficult at all -     GAD 7 : Generalized Anxiety Score 06/18/2020 02/12/2020 10/22/2019 09/23/2019  Nervous, Anxious, on Edge 3 0 0 3  Control/stop worrying 3 0 0 3  Worry too much - different things 3 0 0 3  Trouble relaxing 3 0 0 3  Restless 1 0 0 1  Easily annoyed or irritable 3 0 0 3  Afraid - awful might happen 1 0 0 3  Total GAD 7 Score 17 0 0 19  Anxiety Difficulty Not difficult at  all Not difficult at all Not difficult at all Extremely difficult      Falls  Fall Risk  06/18/2020 02/12/2020 09/23/2019 08/20/2019 06/18/2019  Falls in the past year? 0 0 0 0 0  Number falls in past yr: 0 0 0 0 0  Injury with Fall? 0 0 0 0 0  Risk for fall due to : No Fall Risks No Fall Risks - - -  Follow up Falls evaluation completed Falls evaluation completed - Falls evaluation completed -    Assessment & Plan:   1. Annual visit for general adult medical examination with abnormal findings   2. Depression, major, single episode, moderate (HCC)   3. Situational anxiety   4. Other fatigue   5. Missed menses   6. Shifting sleep-work schedule     Tests ordered Orders Placed This Encounter  Procedures  . CBC  . Comprehensive metabolic panel  . Hemoglobin A1c  . Lipid panel  . VITAMIN D 25 Hydroxy (Vit-D Deficiency, Fractures)  . TSH  . hCG, serum, qualitative     Plan: Please see assessment and plan per problem list above.   Meds ordered this encounter  Medications  . escitalopram (LEXAPRO) 20 MG tablet    Sig: Take 1 tablet (20 mg total) by mouth daily.    Dispense:  90 tablet    Refill:  1    Order Specific Question:   Supervising Provider    Answer:   Syliva Overman E [[id number]]    I have personally reviewed: The patient's medical and social history Their use of alcohol, tobacco or illicit drugs Their current medications and supplements The patient's functional ability including ADLs,fall risks, home safety risks, cognitive, and hearing and visual impairment Diet and physical activities Evidence for depression or mood disorders  The patient's weight, height, BMI, and visual acuity have been recorded in the chart.  I have made referrals, counseling, and provided education to the patient based on review of the above and I have provided the patient with a written personalized care plan for preventive services.    Note: This dictation was prepared with Dragon  dictation along with smaller phrase technology. Similar sounding words can be transcribed inadequately or may not be corrected upon review. Any transcriptional errors that result from this process are unintentional.      Freddy Finner, NP   06/18/2020

## 2020-06-18 NOTE — Patient Instructions (Signed)
  HAPPY FALL!  I appreciate the opportunity to provide you with care for your health and wellness. Today we discussed: overall health   Follow up: 1 year for CPE - fasting or as needed  Labs- fasting today No referrals today  Have a great Holiday Season!  If you are positive on blood work for pregnancy, please stop BC and contact the OBGYN for an appt  Please continue to practice social distancing to keep you, your family, and our community safe.  If you must go out, please wear a mask and practice good handwashing.  It was a pleasure to see you and I look forward to continuing to work together on your health and well-being. Please do not hesitate to call the office if you need care or have questions about your care.  Have a wonderful day and week. With Gratitude, Tereasa Coop, DNP, AGNP-BC  HEALTH MAINTENANCE RECOMMENDATIONS:  It is recommended that you get at least 30 minutes of aerobic exercise at least 5 days/week (for weight loss, you may need as much as 60-90 minutes). This can be any activity that gets your heart rate up. This can be divided in 10-15 minute intervals if needed, but try and build up your endurance at least once a week.  Weight bearing exercise is also recommended twice weekly.  Eat a healthy diet with lots of vegetables, fruits and fiber.  "Colorful" foods have a lot of vitamins (ie green vegetables, tomatoes, red peppers, etc).  Limit sweet tea, regular sodas and alcoholic beverages, all of which has a lot of calories and sugar.  Up to 1 alcoholic drink daily may be beneficial for women (unless trying to lose weight, watch sugars).  Drink a lot of water.  Monthly self breast exams and yearly mammograms for women over the age of 58 is recommended.  Sunscreen of at least SPF 30 should be used on all sun-exposed parts of the skin when outside between the hours of 10 am and 4 pm (not just when at beach or pool, but even with exercise, golf, tennis, and yard work!)   Use a sunscreen that says "broad spectrum" so it covers both UVA and UVB rays, and make sure to reapply every 1-2 hours.  Remember to change the batteries in your smoke detectors when changing your clock times in the spring and fall.  Use your seat belt every time you are in a car, and please drive safely and not be distracted with cell phones and texting while driving.

## 2020-06-19 LAB — COMPREHENSIVE METABOLIC PANEL
ALT: 15 IU/L (ref 0–32)
AST: 18 IU/L (ref 0–40)
Albumin/Globulin Ratio: 1.7 (ref 1.2–2.2)
Albumin: 4.3 g/dL (ref 3.9–5.0)
Alkaline Phosphatase: 58 IU/L (ref 44–121)
BUN/Creatinine Ratio: 21 (ref 9–23)
BUN: 14 mg/dL (ref 6–20)
Bilirubin Total: <0.2 mg/dL (ref 0.0–1.2)
CO2: 22 mmol/L (ref 20–29)
Calcium: 9.6 mg/dL (ref 8.7–10.2)
Chloride: 102 mmol/L (ref 96–106)
Creatinine, Ser: 0.66 mg/dL (ref 0.57–1.00)
GFR calc Af Amer: 139 mL/min/{1.73_m2} (ref >59)
GFR calc non Af Amer: 121 mL/min/{1.73_m2} (ref >59)
Globulin, Total: 2.6 g/dL (ref 1.5–4.5)
Glucose: 87 mg/dL (ref 65–99)
Potassium: 4.3 mmol/L (ref 3.5–5.2)
Sodium: 139 mmol/L (ref 134–144)
Total Protein: 6.9 g/dL (ref 6.0–8.5)

## 2020-06-19 LAB — VITAMIN D 25 HYDROXY (VIT D DEFICIENCY, FRACTURES): Vit D, 25-Hydroxy: 25.4 ng/mL — ABNORMAL LOW (ref 30.0–100.0)

## 2020-06-19 LAB — HEMOGLOBIN A1C
Est. average glucose Bld gHb Est-mCnc: 105 mg/dL
Hgb A1c MFr Bld: 5.3 % (ref 4.8–5.6)

## 2020-06-19 LAB — CBC
Hematocrit: 38.4 % (ref 34.0–46.6)
Hemoglobin: 12.8 g/dL (ref 11.1–15.9)
MCH: 29.7 pg (ref 26.6–33.0)
MCHC: 33.3 g/dL (ref 31.5–35.7)
MCV: 89 fL (ref 79–97)
Platelets: 311 10*3/uL (ref 150–450)
RBC: 4.31 x10E6/uL (ref 3.77–5.28)
RDW: 12.2 % (ref 11.7–15.4)
WBC: 7.3 10*3/uL (ref 3.4–10.8)

## 2020-06-19 LAB — LIPID PANEL
Chol/HDL Ratio: 2.3 ratio (ref 0.0–4.4)
Cholesterol, Total: 189 mg/dL (ref 100–199)
HDL: 81 mg/dL (ref >39)
LDL Chol Calc (NIH): 97 mg/dL (ref 0–99)
Triglycerides: 57 mg/dL (ref 0–149)
VLDL Cholesterol Cal: 11 mg/dL (ref 5–40)

## 2020-06-19 LAB — TSH: TSH: 16.3 u[IU]/mL — ABNORMAL HIGH (ref 0.450–4.500)

## 2020-06-23 NOTE — Addendum Note (Signed)
Addended by: Abner Greenspan on: 06/23/2020 03:30 PM   Modules accepted: Orders

## 2020-06-24 LAB — THYROID PANEL WITH TSH
Free Thyroxine Index: 1.5 (ref 1.2–4.9)
T3 Uptake Ratio: 21 % — ABNORMAL LOW (ref 24–39)
T4, Total: 7.3 ug/dL (ref 4.5–12.0)
TSH: 15 u[IU]/mL — ABNORMAL HIGH (ref 0.450–4.500)

## 2020-06-29 ENCOUNTER — Other Ambulatory Visit: Payer: Self-pay | Admitting: Family Medicine

## 2020-06-29 DIAGNOSIS — E039 Hypothyroidism, unspecified: Secondary | ICD-10-CM

## 2020-06-29 MED ORDER — LEVOTHYROXINE SODIUM 25 MCG PO TABS: 25.0000 ug | ORAL_TABLET | Freq: Every day | ORAL | 2 refills | Status: DC

## 2020-06-29 NOTE — Progress Notes (Signed)
sy

## 2020-06-30 ENCOUNTER — Other Ambulatory Visit: Payer: Self-pay

## 2020-06-30 DIAGNOSIS — E039 Hypothyroidism, unspecified: Secondary | ICD-10-CM

## 2020-06-30 DIAGNOSIS — R5383 Other fatigue: Secondary | ICD-10-CM

## 2020-07-01 LAB — SPECIMEN STATUS REPORT

## 2020-07-01 LAB — HCG, SERUM, QUALITATIVE: hCG,Beta Subunit,Qual,Serum: NEGATIVE m[IU]/mL (ref <6)

## 2020-07-01 MED FILL — LEVOTHYROXINE SODIUM 25 MCG: 25 | 30 days supply | Qty: 30 | Fill #0

## 2020-07-27 MED FILL — ESCITALOPRAM 20 MG TABLET: 20 | 90 days supply | Qty: 90 | Fill #0

## 2020-07-27 MED FILL — LEVOTHYROXINE SODIUM 25 MCG: 25 | 30 days supply | Qty: 30 | Fill #1

## 2020-08-23 ENCOUNTER — Other Ambulatory Visit: Payer: Self-pay

## 2020-08-23 ENCOUNTER — Ambulatory Visit (HOSPITAL_COMMUNITY)
Admission: EM | Admit: 2020-08-23 | Discharge: 2020-08-23 | Disposition: A | Discharge status: Home / Self Care | Payer: No Typology Code available for payment source | Attending: Family Medicine | Admitting: Family Medicine

## 2020-08-23 ENCOUNTER — Encounter (HOSPITAL_COMMUNITY): Payer: Self-pay

## 2020-08-23 DIAGNOSIS — R110 Nausea: Secondary | ICD-10-CM | POA: Diagnosis not present

## 2020-08-23 DIAGNOSIS — E0590 Thyrotoxicosis, unspecified without thyrotoxic crisis or storm: Secondary | ICD-10-CM | POA: Diagnosis not present

## 2020-08-23 DIAGNOSIS — Z20822 Contact with and (suspected) exposure to covid-19: Secondary | ICD-10-CM | POA: Diagnosis not present

## 2020-08-23 DIAGNOSIS — I951 Orthostatic hypotension: Secondary | ICD-10-CM | POA: Diagnosis not present

## 2020-08-23 DIAGNOSIS — R109 Unspecified abdominal pain: Secondary | ICD-10-CM | POA: Insufficient documentation

## 2020-08-23 DIAGNOSIS — Z3200 Encounter for pregnancy test, result unknown: Secondary | ICD-10-CM

## 2020-08-23 LAB — POCT URINALYSIS DIPSTICK, ED / UC
Bilirubin Urine: NEGATIVE
Glucose, UA: NEGATIVE mg/dL
Hgb urine dipstick: NEGATIVE
Leukocytes,Ua: NEGATIVE
Nitrite: NEGATIVE
Protein, ur: NEGATIVE mg/dL
Specific Gravity, Urine: 1.025 (ref 1.005–1.030)
Urobilinogen, UA: 0.2 mg/dL (ref 0.0–1.0)
pH: 7 (ref 5.0–8.0)

## 2020-08-23 LAB — CBC WITH DIFFERENTIAL/PLATELET
Abs Immature Granulocytes: 0.03 10*3/uL (ref 0.00–0.07)
Basophils Absolute: 0 10*3/uL (ref 0.0–0.1)
Basophils Relative: 1 %
Eosinophils Absolute: 0.1 10*3/uL (ref 0.0–0.5)
Eosinophils Relative: 1 %
HCT: 42.4 % (ref 36.0–46.0)
Hemoglobin: 14.6 g/dL (ref 12.0–15.0)
Immature Granulocytes: 0 %
Lymphocytes Relative: 36 %
Lymphs Abs: 3 10*3/uL (ref 0.7–4.0)
MCH: 29.8 pg (ref 26.0–34.0)
MCHC: 34.4 g/dL (ref 30.0–36.0)
MCV: 86.5 fL (ref 80.0–100.0)
Monocytes Absolute: 0.7 10*3/uL (ref 0.1–1.0)
Monocytes Relative: 8 %
Neutro Abs: 4.5 10*3/uL (ref 1.7–7.7)
Neutrophils Relative %: 54 %
Platelets: 306 10*3/uL (ref 150–400)
RBC: 4.9 MIL/uL (ref 3.87–5.11)
RDW: 12.1 % (ref 11.5–15.5)
WBC: 8.4 10*3/uL (ref 4.0–10.5)
nRBC: 0 % (ref 0.0–0.2)

## 2020-08-23 LAB — TSH: TSH: 12.516 u[IU]/mL — ABNORMAL HIGH (ref 0.350–4.500)

## 2020-08-23 LAB — SARS CORONAVIRUS 2 (TAT 6-24 HRS): SARS Coronavirus 2: NEGATIVE

## 2020-08-23 LAB — HCG, QUANTITATIVE, PREGNANCY: hCG, Beta Chain, Quant, S: 1 m[IU]/mL (ref <5)

## 2020-08-23 MED ORDER — METOCLOPRAMIDE HCL 5 MG/ML IJ SOLN
5.0000 mg | INTRAMUSCULAR | Status: AC
  Administered 2020-08-23: 5 mg via INTRAVENOUS

## 2020-08-23 MED ORDER — METOCLOPRAMIDE HCL 5 MG/ML IJ SOLN
INTRAMUSCULAR | Status: AC
  Filled 2020-08-23: qty 2

## 2020-08-23 MED ORDER — ONDANSETRON 4 MG PO TBDP: 4.0000 mg | ORAL_TABLET | Freq: Four times a day (QID) | ORAL | 0 refills | Status: DC | PRN

## 2020-08-23 MED ORDER — SODIUM CHLORIDE 0.9 % IV SOLN: Freq: Once | INTRAVENOUS | Status: AC

## 2020-08-23 MED FILL — ONDANSETRON ODT 4 MG TABLET: 4 | 5 days supply | Qty: 20 | Fill #0

## 2020-08-23 NOTE — ED Provider Notes (Signed)
[facility]    CSN: [id number] Arrival date & time: 08/23/20  4332      History   Chief Complaint Chief Complaint  Patient presents with  . Abdominal Cramping    HPI Olivia Sellers is a 30 y.o. female.   HPI Patient presents today with left lower abdominal cramping, nausea and fatigue gradually worsening over the last 3 days.  Patient is concerned that she is possibly pregnant as she took a home pregnancy test which was negative however reports having a miscarriage back in November following a negative home pregnancy test.  She has had a poor appetite and some generalized fatigue and weakness.  She endorses that her boyfriend has also been ill over the weekend and has had a negative Covid test.  She is fully vaccinated but unable to rule out possible Covid infection given symptoms.  Her last menstrual period was more than 6 weeks ago.During triage RN notified provider that patient was experiencing postural tachycardia along with orthostatic hypotension with standing. Patient also experiencing dizziness during standing.  Past Medical History:  Diagnosis Date  . Anemia   . Anxiety   . Cholelithiasis   . Depression   . Encounter for gynecological examination with Papanicolaou smear of cervix 08/20/2019  . Encounter for initial prescription of contraceptive pills 02/19/2020  . Encounter for Nexplanon removal 02/19/2020  . General counseling and advice for contraceptive management 08/20/2019  . Nexplanon insertion 09/09/2019  . Pilonidal cyst    hx of    Patient Active Problem List   Diagnosis Date Noted  . Annual visit for general adult medical examination with abnormal findings 06/18/2020  . Other fatigue 06/18/2020  . Missed menses 06/18/2020  . Depression, major, single episode, moderate (HCC) 09/23/2019  . Situational anxiety 06/18/2019  . Shifting sleep-work schedule 06/18/2019    Past Surgical History:  Procedure Laterality Date  . CHOLECYSTECTOMY N/A 08/22/2019    Procedure: LAPAROSCOPIC CHOLECYSTECTOMY;  Surgeon: Axel Filler, MD;  Location: [facility] [place];  Service: General;  Laterality: N/A;  . SURGERY OF LIP     suture to lip at age 33  . wisdom teeth removed      OB History    Gravida  0   Para  0   Term  0   Preterm  0   AB  0   Living  0     SAB  0   IAB  0   Ectopic  0   Multiple  0   Live Births  0            Home Medications    Prior to Admission medications   Medication Sig Start Date End Date Taking? Authorizing Provider  ondansetron (ZOFRAN-ODT) 4 MG disintegrating tablet Take 1 tablet (4 mg total) by mouth every 6 (six) hours as needed for nausea. 08/23/20  Yes Bing Neighbors, FNP  escitalopram (LEXAPRO) 20 MG tablet Take 1 tablet (20 mg total) by mouth daily. 06/18/20   Freddy Finner, NP  levothyroxine (SYNTHROID) 25 MCG tablet Take 1 tablet (25 mcg total) by mouth daily before breakfast. 06/29/20   Freddy Finner, NP  norethindrone-ethinyl estradiol (LOESTRIN FE) 1-20 MG-MCG tablet Take 1 tablet by mouth daily. 02/19/20 02/18/21  Adline Potter, NP    Family History Family History  Problem Relation Age of Onset  . Panic disorder Mother   . Drug abuse Mother   . Alcohol abuse Mother   . Depression Mother   .  COPD Mother   . Bipolar disorder Father   . Alcohol abuse Father     Social History Social History   Tobacco Use  . Smoking status: Never Smoker  . Smokeless tobacco: Never Used  Vaping Use  . Vaping Use: Never used  Substance Use Topics  . Alcohol use: Yes    Comment: socially   . Drug use: Never     Allergies   Patient has no known allergies.   Review of Systems Review of Systems Pertinent negatives listed in HPI Physical Exam Triage Vital Signs ED Triage Vitals  Enc Vitals Group     BP 08/23/20 1107 110/80     Pulse Rate 08/23/20 1107 88     Resp 08/23/20 1107 19     Temp 08/23/20 1107 98.5 F (36.9 C)     Temp src --      SpO2 08/23/20  1107 100 %     Weight --      Height --      Head Circumference --      Peak Flow --      Pain Score 08/23/20 1105 2     Pain Loc --      Pain Edu? --      Excl. in GC? --    Orthostatic VS for the past 24 hrs:  BP- Lying Pulse- Lying BP- Sitting Pulse- Sitting BP- Standing at 0 minutes Pulse- Standing at 0 minutes  08/23/20 1108 110/75 88 110/80 114 119/80 120    Updated Vital Signs BP 116/82   Pulse 86   Temp 98.5 F (36.9 C)   Resp 19   LMP 07/12/2020   SpO2 99%   Visual Acuity Right Eye Distance:   Left Eye Distance:   Bilateral Distance:    Right Eye Near:   Left Eye Near:    Bilateral Near:     Physical Exam General appearance: alert, acutely ill-appearing, cooperative, no distress Head: Normocephalic, without obvious abnormality, atraumatic Respiratory: Respirations even and unlabored, normal respiratory rate Heart: Rate  and rhythm normal. No gallop or murmurs noted on exam  Abdomen: BS +, no distention, no rebound tenderness, or no mass Extremities: No gross deformities Skin: Skin color slightly pale, capillary refill >2, skin dry, turgor normal. No rashes seen  Psych: Appropriate mood and affect. Neurologic: Mental status: Alert, oriented to person, place, and time, thought content appropriate.  UC Treatments / Results  Labs (all labs ordered are listed, but only abnormal results are displayed) Labs Reviewed  POCT URINALYSIS DIPSTICK, ED / UC - Abnormal; Notable for the following components:      Result Value   Ketones, ur TRACE (*)    All other components within normal limits  SARS CORONAVIRUS 2 (TAT 6-24 HRS)  CBC WITH DIFFERENTIAL/PLATELET  HCG, QUANTITATIVE, PREGNANCY  TSH    EKG Normal sinus rhythm, 92 bpm no ST changes  Radiology No results found.  Procedures Procedures (including critical care time)  Medications Ordered in UC Medications  metoCLOPramide (REGLAN) injection 5 mg (5 mg Intravenous Given 08/23/20 1136)  0.9 %  sodium  chloride infusion ( Intravenous New Bag/Given 08/23/20 1135)    Initial Impression / Assessment and Plan / UC Course  I have reviewed the triage vital signs and the nursing notes.  Pertinent labs & imaging results that were available during my care of the patient were reviewed by me and considered in my medical decision making (see chart for details).    Patient  presents today with concern for possible pregnancy given last menstrual period over 6 weeks ago.  On arrival she was dizzy, pale hypotensive with standing she was bolused a liter of fluids which improved hydration status and blood pressure.  hCG quantitative is pending, rechecking TSH as patient was recently started on Synthroid for hypothyroidism and has not had a level repeated.  Also checking a Covid test to rule out Covid as a source of patient's symptoms this test is also pending.  Patient encouraged to advance diet as tolerated prescribed Zofran as needed for nausea symptoms.  Overall exam findings today were unremarkable no concern for acute abdomen as patient has no diffuse peritoneal signs on exam.  She is afebrile and heart rate is stable.  Patient advised she will be notified via MyChart of lab results also provided a work note.  ER precautions given. Final Clinical Impressions(s) / UC Diagnoses   Final diagnoses:  Orthostatic hypotension  Possible pregnancy, not confirmed  Abdominal cramping, Left  Hyperthyroidism  Nausea without vomiting     Discharge Instructions     Your labs are still pending once they have resulted I will update you via MyChart of any results or any additional follow-up is warranted.  In the meantime I have prescribed you Zofran which she can take every 6 hours as needed for nausea.  Recommend rest, eating small meals and hydrating for the remainder of the day.   ED Prescriptions    Medication Sig Dispense Auth. Provider   ondansetron (ZOFRAN-ODT) 4 MG disintegrating tablet Take 1 tablet (4 mg  total) by mouth every 6 (six) hours as needed for nausea. 20 tablet Bing Neighbors, FNP     PDMP not reviewed this encounter.   Bing Neighbors, FNP 08/23/20 1320

## 2020-08-23 NOTE — Discharge Instructions (Addendum)
Your labs are still pending once they have resulted I will update you via MyChart of any results or any additional follow-up is warranted.  In the meantime I have prescribed you Zofran which she can take every 6 hours as needed for nausea.  Recommend rest, eating small meals and hydrating for the remainder of the day.

## 2020-08-23 NOTE — ED Notes (Signed)
Jerrilyn Cairo NP notified of patients abnormal orthostatics and at bedside.

## 2020-08-23 NOTE — ED Triage Notes (Signed)
Pt presents with complaints of abdominal cramping, nausea, and fatigue. Reports she missed her period 6 weeks ago with some spotting. Reports that she had a negative pregnancy test at home. Reports she had a miscarriage in November and that she did not test positive for weeks with that pregnancy.   Pt started synthroid recently. States she was having some dizziness this morning with ambulation.

## 2020-08-26 ENCOUNTER — Other Ambulatory Visit: Payer: Self-pay

## 2020-08-26 ENCOUNTER — Other Ambulatory Visit: Payer: Self-pay | Admitting: Internal Medicine

## 2020-08-26 ENCOUNTER — Encounter: Payer: Self-pay | Admitting: Internal Medicine

## 2020-08-26 ENCOUNTER — Ambulatory Visit (INDEPENDENT_AMBULATORY_CARE_PROVIDER_SITE_OTHER): Payer: No Typology Code available for payment source | Admitting: Internal Medicine

## 2020-08-26 VITALS — BP 118/82 | HR 83 | Temp 98.5°F | Resp 18 | Ht 67.5 in | Wt 154.4 lb

## 2020-08-26 DIAGNOSIS — E039 Hypothyroidism, unspecified: Secondary | ICD-10-CM | POA: Insufficient documentation

## 2020-08-26 DIAGNOSIS — Z09 Encounter for follow-up examination after completed treatment for conditions other than malignant neoplasm: Secondary | ICD-10-CM | POA: Diagnosis not present

## 2020-08-26 DIAGNOSIS — I951 Orthostatic hypotension: Secondary | ICD-10-CM | POA: Diagnosis not present

## 2020-08-26 DIAGNOSIS — R110 Nausea: Secondary | ICD-10-CM

## 2020-08-26 MED ORDER — LEVOTHYROXINE SODIUM 25 MCG PO TABS: 50.0000 ug | ORAL_TABLET | Freq: Every day | ORAL | 2 refills | Status: DC

## 2020-08-26 MED ORDER — LEVOTHYROXINE SODIUM 50 MCG PO TABS: 50.0000 ug | ORAL_TABLET | Freq: Every day | ORAL | 2 refills | Status: DC

## 2020-08-26 MED FILL — LEVOTHYROXINE 50 MCG TABLET: 50 | 30 days supply | Qty: 30 | Fill #0

## 2020-08-26 NOTE — Patient Instructions (Signed)
Please start taking Levothyroxine 50 mcg instead of 25 mcg once daily.  Please get blood tests done after 3 months.  Please stay hydrated by taking at least 1.5 liters of fluid in a day.  Please take Ibuprofen for menstruation related cramping.

## 2020-08-26 NOTE — Progress Notes (Signed)
Acute Office Visit  Subjective:    Patient ID: Olivia Sellers, female    DOB: 1991-06-18, 30 y.o.   MRN: [id number]  Chief Complaint  Patient presents with  . Follow-up    Urgent care follow up was having dizziness sob almost passed out they gave her fluids she did feel better was still dizzy and weak did labs at urgent care TSH was still elevated     HPI Patient is in today for follow up after an ER visit for nausea and abdominal cramping. She missed her period, but she had negative pregnancy test in the Urgent care. Her orthostatics were positive and she was given IV fluids, which made her feel better. She denies any chest pain, dyspnea or palpitations. Her COVID test was negative. She has an appointment with her OB/GYN.  She had been taking Levothyroxine regularly, but her TSH is still elevated. She states that she has gained some weight since starting Levothyroxine. But denies any activity change, excessive sweating or LE edema.  Past Medical History:  Diagnosis Date  . Anemia   . Anxiety   . Cholelithiasis   . Depression   . Encounter for gynecological examination with Papanicolaou smear of cervix 08/20/2019  . Encounter for initial prescription of contraceptive pills 02/19/2020  . Encounter for Nexplanon removal 02/19/2020  . General counseling and advice for contraceptive management 08/20/2019  . Nexplanon insertion 09/09/2019  . Pilonidal cyst    hx of    Past Surgical History:  Procedure Laterality Date  . CHOLECYSTECTOMY N/A 08/22/2019   Procedure: LAPAROSCOPIC CHOLECYSTECTOMY;  Surgeon: Axel Filler, MD;  Location: [facility];  Service: General;  Laterality: N/A;  . SURGERY OF LIP     suture to lip at age 50  . wisdom teeth removed      Family History  Problem Relation Age of Onset  . Panic disorder Mother   . Drug abuse Mother   . Alcohol abuse Mother   . Depression Mother   . COPD Mother   . Bipolar disorder Father   . Alcohol abuse Father      Social History   Socioeconomic History  . Marital status: Single    Spouse name: Not on file  . Number of children: Not on file  . Years of education: Not on file  . Highest education level: Associate degree: academic program  Occupational History  . Occupation: Charity fundraiser  Tobacco Use  . Smoking status: Never Smoker  . Smokeless tobacco: Never Used  Vaping Use  . Vaping Use: Never used  Substance and Sexual Activity  . Alcohol use: Yes    Comment: socially   . Drug use: Never  . Sexual activity: Yes    Birth control/protection: Pill  Other Topics Concern  . Not on file  Social History Narrative   Lives alone currently   No pets      Enjoys drawing      Diet: was eating fast food a lot, eating more veggies and fruits now, beans-for vegetarian like    Caffeine: 3 cups a coffee, no energy drinks, some sweet tea   Water: 2 cups -does drink milk daily      Wears seat belt   Wears sun protection   Smoke detectors    Does not use phone while driving       Social Determinants of Health   Financial Resource Strain: Low Risk   . Difficulty of Paying Living Expenses: Not hard at all  Food Insecurity: No Food Insecurity  . Worried About Programme researcher, broadcasting/film/video in the Last Year: Never true  . Ran Out of Food in the Last Year: Never true  Transportation Needs: No Transportation Needs  . Lack of Transportation (Medical): No  . Lack of Transportation (Non-Medical): No  Physical Activity: Insufficiently Active  . Days of Exercise per Week: 2 days  . Minutes of Exercise per Session: 60 min  Stress: No Stress Concern Present  . Feeling of Stress : Not at all  Social Connections: Socially Isolated  . Frequency of Communication with Friends and Family: Once a week  . Frequency of Social Gatherings with Friends and Family: Once a week  . Attends Religious Services: Never  . Active Member of Clubs or Organizations: No  . Attends Banker Meetings: Never  . Marital Status:  Living with partner  Intimate Partner Violence: Not At Risk  . Fear of Current or Ex-Partner: No  . Emotionally Abused: No  . Physically Abused: No  . Sexually Abused: No    Outpatient Medications Prior to Visit  Medication Sig Dispense Refill  . escitalopram (LEXAPRO) 20 MG tablet Take 1 tablet (20 mg total) by mouth daily. 90 tablet 1  . norethindrone-ethinyl estradiol (LOESTRIN FE) 1-20 MG-MCG tablet Take 1 tablet by mouth daily. 84 tablet 3  . ondansetron (ZOFRAN-ODT) 4 MG disintegrating tablet Take 1 tablet (4 mg total) by mouth every 6 (six) hours as needed for nausea. (Patient not taking: Reported on 08/26/2020) 20 tablet 0  . levothyroxine (SYNTHROID) 25 MCG tablet Take 1 tablet (25 mcg total) by mouth daily before breakfast. (Patient not taking: Reported on 08/26/2020) 30 tablet 2   No facility-administered medications prior to visit.    No Known Allergies  Review of Systems  Constitutional: Negative for chills and fever.  HENT: Negative for sinus pressure, sinus pain, sore throat, trouble swallowing and voice change.   Eyes: Negative for pain and discharge.  Respiratory: Negative for cough and shortness of breath.   Cardiovascular: Negative for chest pain and palpitations.  Gastrointestinal: Positive for nausea. Negative for constipation, diarrhea and vomiting.  Genitourinary: Positive for menstrual problem. Negative for dysuria and hematuria.  Musculoskeletal: Negative for neck pain and neck stiffness.  Skin: Negative for rash.  Neurological: Negative for seizures and syncope.  Psychiatric/Behavioral: Negative for agitation and behavioral problems.       Objective:    Physical Exam Vitals reviewed.  Constitutional:      General: She is not in acute distress.    Appearance: She is not diaphoretic.  HENT:     Head: Normocephalic and atraumatic.     Nose: Nose normal.     Mouth/Throat:     Mouth: Mucous membranes are moist.  Eyes:     General: No scleral  icterus.    Extraocular Movements: Extraocular movements intact.     Pupils: Pupils are equal, round, and reactive to light.  Cardiovascular:     Rate and Rhythm: Normal rate and regular rhythm.     Pulses: Normal pulses.     Heart sounds: Normal heart sounds. No murmur heard.   Pulmonary:     Breath sounds: Normal breath sounds. No wheezing or rales.  Abdominal:     Palpations: Abdomen is soft.     Tenderness: There is no abdominal tenderness.  Musculoskeletal:     Cervical back: Neck supple. No rigidity or tenderness.     Right lower leg: No edema.  Left lower leg: No edema.  Lymphadenopathy:     Cervical: No cervical adenopathy.  Skin:    General: Skin is warm.     Findings: No rash.  Neurological:     General: No focal deficit present.     Mental Status: She is alert and oriented to person, place, and time.     Sensory: No sensory deficit.     Motor: No weakness.  Psychiatric:        Mood and Affect: Mood normal.        Behavior: Behavior normal.     BP 118/82 (BP Location: Right Arm, Patient Position: Sitting, Cuff Size: Normal)   Pulse 83   Temp 98.5 F (36.9 C) (Oral)   Resp 18   Ht 5' 7.5" (1.715 m)   Wt 154 lb 6.4 oz (70 kg)   SpO2 100%   BMI 23.83 kg/m  Wt Readings from Last 3 Encounters:  08/26/20 154 lb 6.4 oz (70 kg)  06/18/20 147 lb (66.7 kg)  02/19/20 140 lb (63.5 kg)    There are no preventive care reminders to display for this patient.  There are no preventive care reminders to display for this patient.   Lab Results  Component Value Date   TSH 12.516 (H) 08/23/2020   Lab Results  Component Value Date   WBC 8.4 08/23/2020   HGB 14.6 08/23/2020   HCT 42.4 08/23/2020   MCV 86.5 08/23/2020   PLT 306 08/23/2020   Lab Results  Component Value Date   NA 139 06/18/2020   K 4.3 06/18/2020   CO2 22 06/18/2020   GLUCOSE 87 06/18/2020   BUN 14 06/18/2020   CREATININE 0.66 06/18/2020   BILITOT <0.2 06/18/2020   ALKPHOS 58 06/18/2020    AST 18 06/18/2020   ALT 15 06/18/2020   PROT 6.9 06/18/2020   ALBUMIN 4.3 06/18/2020   CALCIUM 9.6 06/18/2020   ANIONGAP 13 08/09/2019   Lab Results  Component Value Date   CHOL 189 06/18/2020   Lab Results  Component Value Date   HDL 81 06/18/2020   Lab Results  Component Value Date   LDLCALC 97 06/18/2020   Lab Results  Component Value Date   TRIG 57 06/18/2020   Lab Results  Component Value Date   CHOLHDL 2.3 06/18/2020   Lab Results  Component Value Date   HGBA1C 5.3 06/18/2020       Assessment & Plan:   Problem List Items Addressed This Visit      Cardiovascular and Mediastinum   Orthostatic hypotension in Urgent care Advised to stay hydrated by increasing fluid intake Contact if persistent palpitations or dizziness Would r/o POTS if persistent symptoms EKG: NSR. No signs of active ischemia.     Endocrine   Acquired hypothyroidism - Primary Lab Results  Component Value Date   TSH 12.516 (H) 08/23/2020   Increased Levothyroxine to 50 mcg QD F/u TSH and free T4 before the next visit   Relevant Medications   levothyroxine (SYNTHROID) 50 MCG tablet    Other Visit Diagnoses    Encounter for examination following treatment at hospital     ER chart reviewed Medications reconciled and discussed with the patient   Nausea and abdominal cramping Zofran PRN Ibuprofen for menstrual cramping F/u with OB/GYN for missed period       Meds ordered this encounter  Medications  . DISCONTD: levothyroxine (SYNTHROID) 25 MCG tablet    Sig: Take 2 tablets (50 mcg total) by  mouth daily before breakfast.    Dispense:  30 tablet    Refill:  2  . levothyroxine (SYNTHROID) 50 MCG tablet    Sig: Take 1 tablet (50 mcg total) by mouth daily before breakfast.    Dispense:  30 tablet    Refill:  2     Oniya Mandarino Concha Se, MD

## 2020-08-30 ENCOUNTER — Encounter: Payer: Self-pay | Admitting: Adult Health

## 2020-08-30 ENCOUNTER — Other Ambulatory Visit: Payer: Self-pay

## 2020-08-30 ENCOUNTER — Telehealth: Payer: Self-pay | Admitting: Adult Health

## 2020-08-30 ENCOUNTER — Ambulatory Visit (INDEPENDENT_AMBULATORY_CARE_PROVIDER_SITE_OTHER): Payer: No Typology Code available for payment source | Admitting: Adult Health

## 2020-08-30 ENCOUNTER — Ambulatory Visit: Payer: No Typology Code available for payment source | Admitting: Adult Health

## 2020-08-30 VITALS — BP 122/83 | HR 99 | Ht 67.5 in | Wt 151.5 lb

## 2020-08-30 DIAGNOSIS — R109 Unspecified abdominal pain: Secondary | ICD-10-CM | POA: Diagnosis not present

## 2020-08-30 DIAGNOSIS — N926 Irregular menstruation, unspecified: Secondary | ICD-10-CM

## 2020-08-30 DIAGNOSIS — Z3202 Encounter for pregnancy test, result negative: Secondary | ICD-10-CM

## 2020-08-30 DIAGNOSIS — R110 Nausea: Secondary | ICD-10-CM

## 2020-08-30 DIAGNOSIS — R42 Dizziness and giddiness: Secondary | ICD-10-CM

## 2020-08-30 LAB — POCT URINE PREGNANCY: Preg Test, Ur: NEGATIVE

## 2020-08-30 NOTE — Progress Notes (Signed)
  Subjective:     Patient ID: Olivia Sellers, female   DOB: 08/17/1990, 30 y.o.   MRN: [id number]  HPI Olivia Sellers is a 30 year old white female,single, G0P0, in complaining of having missed a period and has cramping for about 9 days now with nausea and has dizzy spells with tachycardia and Shortness of breath for about 7 days was seen at Urgent Care last month and given IV fluids for dehydration she says. She has recently been diagnosed with hypothyroidism and is on meds. She is on OCs. She missed work yesterday due to being dizzy.  PCP is Tereasa Coop NP  Review of Systems +missed period +nausea +cramping +dizzy spells +tachycarida and shortness of breath at times. Has pain with deep penetration. Reviewed past medical,surgical, social and family history. Reviewed medications and allergies.     Objective:   Physical Exam BP 122/83 (BP Location: Left Arm, Patient Position: Sitting, Cuff Size: Normal)   Pulse 99   Ht 5' 7.5" (1.715 m)   Wt 151 lb 8 oz (68.7 kg)   LMP 07/17/2020   BMI 23.38 kg/m UPT is negative,Skin warm and dry.. Lungs: clear to ausculation bilaterally. Cardiovascular: regular rate and rhythm. Pelvic: external genitalia is normal in appearance no lesions, vagina: white discharge without odor,urethra has no lesions or masses noted, cervix:smooth, uterus: normal size, shape and contour, non tender, no masses felt, adnexa: no masses or tenderness noted. Bladder is non tender and no masses felt.  Fall risk is low  [facility] - 08/30/20 0947      Pregnancy Intention Screening   Does the patient want to become pregnant in the next year? No    Does the patient's partner want to become pregnant in the next year? No    Would the patient like to discuss contraceptive options today? No      Contraception Wrap Up   Current Method Oral Contraceptive    End Method Oral Contraceptive    Contraception Counseling Provided No         Examination chaperoned by Malachy Mood LPN     Assessment:     1. Pregnancy examination or test, negative result - POCT urine pregnancy  2. Abdominal cramping Will get GYN [place] to assess uterus and ovaries when available - US PELVIS (TRANSABDOMINAL ONLY); Future - US PELVIS TRANSVAGINAL NON-OB (TV ONLY); Future  3. Missed period - Beta hCG quant (ref lab) - US PELVIS (TRANSABDOMINAL ONLY); Future - US PELVIS TRANSVAGINAL NON-OB (TV ONLY); Future  4. Nausea She has zofran if needed already - Comprehensive metabolic panel  5. Dizzy spells Get Pedialyte to stay hydrated     Plan:    Will get GC/CHL on urine. We will talk when results back, but return to see me about 09/14/20

## 2020-08-30 NOTE — Telephone Encounter (Signed)
LVM informing patient that our office didn't have water and to call our office to reschedule appt.

## 2020-08-31 ENCOUNTER — Ambulatory Visit (INDEPENDENT_AMBULATORY_CARE_PROVIDER_SITE_OTHER): Payer: No Typology Code available for payment source

## 2020-08-31 DIAGNOSIS — N926 Irregular menstruation, unspecified: Secondary | ICD-10-CM

## 2020-08-31 DIAGNOSIS — R109 Unspecified abdominal pain: Secondary | ICD-10-CM

## 2020-08-31 LAB — COMPREHENSIVE METABOLIC PANEL
ALT: 24 IU/L (ref 0–32)
AST: 19 IU/L (ref 0–40)
Albumin/Globulin Ratio: 1.8 (ref 1.2–2.2)
Albumin: 4.4 g/dL (ref 3.9–5.0)
Alkaline Phosphatase: 73 IU/L (ref 44–121)
BUN/Creatinine Ratio: 16 (ref 9–23)
BUN: 12 mg/dL (ref 6–20)
Bilirubin Total: 0.3 mg/dL (ref 0.0–1.2)
CO2: 22 mmol/L (ref 20–29)
Calcium: 9.3 mg/dL (ref 8.7–10.2)
Chloride: 103 mmol/L (ref 96–106)
Creatinine, Ser: 0.73 mg/dL (ref 0.57–1.00)
GFR calc Af Amer: 129 mL/min/{1.73_m2} (ref >59)
GFR calc non Af Amer: 112 mL/min/{1.73_m2} (ref >59)
Globulin, Total: 2.5 g/dL (ref 1.5–4.5)
Glucose: 89 mg/dL (ref 65–99)
Potassium: 4.5 mmol/L (ref 3.5–5.2)
Sodium: 141 mmol/L (ref 134–144)
Total Protein: 6.9 g/dL (ref 6.0–8.5)

## 2020-08-31 LAB — BETA HCG QUANT (REF LAB): hCG Quant: <1 m[IU]/mL

## 2020-08-31 LAB — GC/CHLAMYDIA PROBE AMP
Chlamydia trachomatis, NAA: NEGATIVE
Neisseria Gonorrhoeae by PCR: NEGATIVE

## 2020-08-31 NOTE — Progress Notes (Signed)
PELVIC US TA/TV: homogeneous anteverted uterus,wnl,EEC 10.5 mm,normal left ovary,2.8 x 2.2 x 1.9 cm complex heterogeneous mass with a vascular ring of color (? involving corpus luteum),small amount of simple cul de sac fluid,no pain during ultrasound,ovaries appear mobile  [employer]

## 2020-09-14 ENCOUNTER — Ambulatory Visit (INDEPENDENT_AMBULATORY_CARE_PROVIDER_SITE_OTHER): Payer: No Typology Code available for payment source | Admitting: Adult Health

## 2020-09-14 ENCOUNTER — Encounter: Payer: Self-pay | Admitting: Adult Health

## 2020-09-14 ENCOUNTER — Other Ambulatory Visit: Payer: Self-pay

## 2020-09-14 VITALS — BP 105/66 | HR 80 | Ht 67.5 in | Wt 152.0 lb

## 2020-09-14 DIAGNOSIS — R42 Dizziness and giddiness: Secondary | ICD-10-CM | POA: Diagnosis not present

## 2020-09-14 DIAGNOSIS — R110 Nausea: Secondary | ICD-10-CM | POA: Diagnosis not present

## 2020-09-14 NOTE — Progress Notes (Signed)
  Subjective:     Patient ID: Olivia Sellers, female   DOB: Dec 16, 1990, 30 y.o.   MRN: [id number]  HPI Olivia Sellers is a 30 year old white female,single, G0P0, back in follow up, has had period and feels better, still has some dizzy spells and nausea at times. Pap was negative malignancy and HPV 08/20/19 PCP is Tereasa Coop NP.   Review of Systems Had period, lasted 2 days and was heavy Still has some dizzy spells and nausea, but better usually happens on awakening and and about 2 am at work Reviewed past medical,surgical, social and family history. Reviewed medications and allergies.     Objective:   Physical Exam BP 105/66 (BP Location: Left Arm, Patient Position: Sitting, Cuff Size: Normal)   Pulse 80   Ht 5' 7.5" (1.715 m)   Wt 152 lb (68.9 kg)   LMP 09/09/2020   BMI 23.46 kg/m  Skin warm and dry. Neck: mid line trachea, normal thyroid, good ROM, no lymphadenopathy noted. Lungs: clear to ausculation bilaterally. Cardiovascular: regular rate and rhythm.   negative Romberg, and CN2-12 intact, exam by Modesto Charon NP student.  [facility] - 09/14/20 1516      Pregnancy Intention Screening   Does the patient want to become pregnant in the next year? No    Does the patient's partner want to become pregnant in the next year? No    Would the patient like to discuss contraceptive options today? No      Contraception Wrap Up   Current Method Oral Contraceptive    End Method Oral Contraceptive    Contraception Counseling Provided No         Reviewed [place] with her, was normal with corpus luteum cyst on the right.  Assessment:     1. Dizzy spells Call PCP to get referral to ENT, may have ear rocks,causing vertigo.   2. Nausea Has zofran if needed     Plan:     Follow up prn

## 2020-09-23 MED FILL — LEVOTHYROXINE 50 MCG TABLET: 50 | 30 days supply | Qty: 30 | Fill #1

## 2020-11-01 ENCOUNTER — Other Ambulatory Visit (HOSPITAL_COMMUNITY): Payer: Self-pay

## 2020-11-01 MED FILL — Escitalopram Oxalate Tab 20 MG (Base Equiv): ORAL | 90 days supply | Qty: 90 | Fill #0 | Status: AC

## 2020-11-01 MED FILL — Levothyroxine Sodium Tab 50 MCG: ORAL | 30 days supply | Qty: 30 | Fill #0 | Status: AC

## 2020-11-01 MED FILL — Norethindrone Ace & Ethinyl Estradiol-FE Tab 1 MG-20 MCG: ORAL | 84 days supply | Qty: 84 | Fill #0 | Status: AC

## 2020-11-25 ENCOUNTER — Encounter: Payer: Self-pay | Admitting: Internal Medicine

## 2020-11-25 ENCOUNTER — Other Ambulatory Visit: Payer: Self-pay

## 2020-11-25 ENCOUNTER — Ambulatory Visit (INDEPENDENT_AMBULATORY_CARE_PROVIDER_SITE_OTHER): Payer: No Typology Code available for payment source | Admitting: Internal Medicine

## 2020-11-25 VITALS — BP 112/78 | HR 70 | Resp 18 | Ht 67.5 in | Wt 153.1 lb

## 2020-11-25 DIAGNOSIS — E039 Hypothyroidism, unspecified: Secondary | ICD-10-CM | POA: Diagnosis not present

## 2020-11-25 DIAGNOSIS — F321 Major depressive disorder, single episode, moderate: Secondary | ICD-10-CM

## 2020-11-25 NOTE — Assessment & Plan Note (Addendum)
Well controlled with Lexapro ?

## 2020-11-25 NOTE — Progress Notes (Signed)
Established Patient Office Visit  Subjective:  Patient ID: Olivia Sellers, female    DOB: 1990/10/06  Age: 30 y.o. MRN: [id number]  CC:  Chief Complaint  Patient presents with  . Follow-up    3 month follow up     HPI Olivia Sellers presents for follow up of hypothyroidism and depression.  She has been doing well since increase in Levothyroxine dose. She denies any constipation/diarrhea, recent appetite or weight change. She has been feeling more energetic now.  She has been doing well with Lexapro. Denies anhedonia, anxiety spells, SI or HI.  Past Medical History:  Diagnosis Date  . Anemia   . Anxiety   . Cholelithiasis   . Depression   . Encounter for gynecological examination with Papanicolaou smear of cervix 08/20/2019  . Encounter for initial prescription of contraceptive pills 02/19/2020  . Encounter for Nexplanon removal 02/19/2020  . General counseling and advice for contraceptive management 08/20/2019  . Nexplanon insertion 09/09/2019  . Pilonidal cyst    hx of    Past Surgical History:  Procedure Laterality Date  . CHOLECYSTECTOMY N/A 08/22/2019   Procedure: LAPAROSCOPIC CHOLECYSTECTOMY;  Surgeon: Axel Filler, MD;  Location: [facility];  Service: General;  Laterality: N/A;  . SURGERY OF LIP     suture to lip at age 26  . wisdom teeth removed      Family History  Problem Relation Age of Onset  . Panic disorder Mother   . Drug abuse Mother   . Alcohol abuse Mother   . Depression Mother   . COPD Mother   . Bipolar disorder Father   . Alcohol abuse Father     Social History   Socioeconomic History  . Marital status: Single    Spouse name: Not on file  . Number of children: Not on file  . Years of education: Not on file  . Highest education level: Associate degree: academic program  Occupational History  . Occupation: Charity fundraiser  Tobacco Use  . Smoking status: Never Smoker  . Smokeless tobacco: Never Used  Vaping Use  . Vaping Use: Never  used  Substance and Sexual Activity  . Alcohol use: Yes    Comment: socially   . Drug use: Never  . Sexual activity: Yes    Birth control/protection: Pill  Other Topics Concern  . Not on file  Social History Narrative   Lives alone currently   No pets      Enjoys drawing      Diet: was eating fast food a lot, eating more veggies and fruits now, beans-for vegetarian like    Caffeine: 3 cups a coffee, no energy drinks, some sweet tea   Water: 2 cups -does drink milk daily      Wears seat belt   Wears sun protection   Smoke detectors    Does not use phone while driving       Social Determinants of Health   Financial Resource Strain: Low Risk   . Difficulty of Paying Living Expenses: Not hard at all  Food Insecurity: No Food Insecurity  . Worried About Programme researcher, broadcasting/film/video in the Last Year: Never true  . Ran Out of Food in the Last Year: Never true  Transportation Needs: No Transportation Needs  . Lack of Transportation (Medical): No  . Lack of Transportation (Non-Medical): No  Physical Activity: Insufficiently Active  . Days of Exercise per Week: 2 days  . Minutes of Exercise per Session: 60 min  Stress: No Stress Concern Present  . Feeling of Stress : Not at all  Social Connections: Socially Isolated  . Frequency of Communication with Friends and Family: Once a week  . Frequency of Social Gatherings with Friends and Family: Once a week  . Attends Religious Services: Never  . Active Member of Clubs or Organizations: No  . Attends Banker Meetings: Never  . Marital Status: Living with partner  Intimate Partner Violence: Not At Risk  . Fear of Current or Ex-Partner: No  . Emotionally Abused: No  . Physically Abused: No  . Sexually Abused: No    Outpatient Medications Prior to Visit  Medication Sig Dispense Refill  . escitalopram (LEXAPRO) 20 MG tablet Take 1 tablet (20 mg total) by mouth daily. 90 tablet 1  . levothyroxine (SYNTHROID) 50 MCG tablet Take  1 tablet (50 mcg total) by mouth daily before breakfast. 30 tablet 2  . norethindrone-ethinyl estradiol (LOESTRIN FE) 1-20 MG-MCG tablet TAKE 1 TABLET BY MOUTH ONCE DAILY. 140 tablet 0  . ondansetron (ZOFRAN-ODT) 4 MG disintegrating tablet TAKE 1 TABLET (4 MG TOTAL) BY MOUTH EVERY 6 (SIX) HOURS AS NEEDED FOR NAUSEA. 20 tablet 0   No facility-administered medications prior to visit.    No Known Allergies  ROS Review of Systems  Constitutional: Negative for chills and fever.  HENT: Negative for sinus pressure, sinus pain, sore throat, trouble swallowing and voice change.   Eyes: Negative for pain and discharge.  Respiratory: Negative for cough and shortness of breath.   Cardiovascular: Negative for chest pain and palpitations.  Gastrointestinal: Negative for constipation, diarrhea, nausea and vomiting.  Genitourinary: Negative for dysuria and hematuria.  Musculoskeletal: Negative for neck pain and neck stiffness.  Skin: Negative for rash.  Neurological: Negative for seizures and syncope.  Psychiatric/Behavioral: Negative for agitation and behavioral problems.      Objective:    Physical Exam Vitals reviewed.  Constitutional:      General: She is not in acute distress.    Appearance: She is not diaphoretic.  HENT:     Head: Normocephalic and atraumatic.     Nose: Nose normal. No congestion.     Mouth/Throat:     Mouth: Mucous membranes are moist.     Pharynx: No posterior oropharyngeal erythema.  Eyes:     General: No scleral icterus.    Extraocular Movements: Extraocular movements intact.  Cardiovascular:     Rate and Rhythm: Normal rate and regular rhythm.     Pulses: Normal pulses.     Heart sounds: Normal heart sounds. No murmur heard.   Pulmonary:     Breath sounds: Normal breath sounds. No wheezing or rales.  Musculoskeletal:     Cervical back: Neck supple. No tenderness.     Right lower leg: No edema.     Left lower leg: No edema.  Skin:    General: Skin is  warm.     Findings: No rash.  Neurological:     General: No focal deficit present.     Mental Status: She is alert and oriented to person, place, and time.  Psychiatric:        Mood and Affect: Mood normal.        Behavior: Behavior normal.     BP 112/78 (BP Location: Right Arm, Patient Position: Sitting, Cuff Size: Normal)   Pulse 70   Resp 18   Ht 5' 7.5" (1.715 m)   Wt 153 lb 1.9 oz (69.5 kg)  SpO2 98%   BMI 23.63 kg/m  Wt Readings from Last 3 Encounters:  11/25/20 153 lb 1.9 oz (69.5 kg)  09/14/20 152 lb (68.9 kg)  08/30/20 151 lb 8 oz (68.7 kg)     Health Maintenance Due  Topic Date Due  . COVID-19 Vaccine (3 - Booster for Moderna series) 09/27/2020    There are no preventive care reminders to display for this patient.  Lab Results  Component Value Date   TSH 12.516 (H) 08/23/2020   Lab Results  Component Value Date   WBC 8.4 08/23/2020   HGB 14.6 08/23/2020   HCT 42.4 08/23/2020   MCV 86.5 08/23/2020   PLT 306 08/23/2020   Lab Results  Component Value Date   NA 141 08/30/2020   K 4.5 08/30/2020   CO2 22 08/30/2020   GLUCOSE 89 08/30/2020   BUN 12 08/30/2020   CREATININE 0.73 08/30/2020   BILITOT 0.3 08/30/2020   ALKPHOS 73 08/30/2020   AST 19 08/30/2020   ALT 24 08/30/2020   PROT 6.9 08/30/2020   ALBUMIN 4.4 08/30/2020   CALCIUM 9.3 08/30/2020   ANIONGAP 13 08/09/2019   Lab Results  Component Value Date   CHOL 189 06/18/2020   Lab Results  Component Value Date   HDL 81 06/18/2020   Lab Results  Component Value Date   LDLCALC 97 06/18/2020   Lab Results  Component Value Date   TRIG 57 06/18/2020   Lab Results  Component Value Date   CHOLHDL 2.3 06/18/2020   Lab Results  Component Value Date   HGBA1C 5.3 06/18/2020      Assessment & Plan:   Problem List Items Addressed This Visit      Endocrine   Acquired hypothyroidism - Primary    Lab Results  Component Value Date   TSH 12.516 (H) 08/23/2020   On Levothyroxine 50  mcg QD Check TSH and free T4      Relevant Orders   TSH + free T4   Basic Metabolic Panel (BMET)     Other   Depression, major, single episode, moderate (HCC)    Well-controlled with Lexapro         No orders of the defined types were placed in this encounter.   Follow-up: Return in about 7 months (around 06/27/2021) for Annual physical.    Anabel Halon, MD

## 2020-11-25 NOTE — Patient Instructions (Signed)
Please continue taking medications as prescribed.  We will contact you with blood test results.

## 2020-11-25 NOTE — Assessment & Plan Note (Signed)
Lab Results  Component Value Date   TSH 12.516 (H) 08/23/2020   On Levothyroxine 50 mcg QD Check TSH and free T4

## 2020-11-26 LAB — BASIC METABOLIC PANEL
BUN/Creatinine Ratio: 13 (ref 9–23)
BUN: 9 mg/dL (ref 6–20)
CO2: 23 mmol/L (ref 20–29)
Calcium: 9.4 mg/dL (ref 8.7–10.2)
Chloride: 100 mmol/L (ref 96–106)
Creatinine, Ser: 0.68 mg/dL (ref 0.57–1.00)
Glucose: 93 mg/dL (ref 65–99)
Potassium: 4.1 mmol/L (ref 3.5–5.2)
Sodium: 136 mmol/L (ref 134–144)
eGFR: 121 mL/min/{1.73_m2} (ref >59)

## 2020-11-26 LAB — TSH+FREE T4
Free T4: 1.24 ng/dL (ref 0.82–1.77)
TSH: 3.4 u[IU]/mL (ref 0.450–4.500)

## 2020-12-01 ENCOUNTER — Other Ambulatory Visit: Payer: Self-pay | Admitting: Internal Medicine

## 2020-12-01 ENCOUNTER — Other Ambulatory Visit (HOSPITAL_COMMUNITY): Payer: Self-pay

## 2020-12-01 DIAGNOSIS — E039 Hypothyroidism, unspecified: Secondary | ICD-10-CM

## 2020-12-01 MED ORDER — LEVOTHYROXINE SODIUM 50 MCG PO TABS
50.0000 ug | ORAL_TABLET | Freq: Every day | ORAL | 2 refills | Status: DC
  Filled 2020-12-01: qty 30, 30d supply, fill #0
  Filled 2021-01-05: qty 30, 30d supply, fill #1
  Filled 2021-02-09: qty 30, 30d supply, fill #2

## 2021-01-05 ENCOUNTER — Other Ambulatory Visit (HOSPITAL_COMMUNITY): Payer: Self-pay

## 2021-02-03 ENCOUNTER — Encounter: Payer: Self-pay | Admitting: Nurse Practitioner

## 2021-02-03 ENCOUNTER — Telehealth (INDEPENDENT_AMBULATORY_CARE_PROVIDER_SITE_OTHER): Payer: No Typology Code available for payment source | Admitting: Nurse Practitioner

## 2021-02-03 ENCOUNTER — Ambulatory Visit (HOSPITAL_COMMUNITY)
Admission: RE | Admit: 2021-02-03 | Discharge: 2021-02-03 | Disposition: A | Discharge status: Home / Self Care | Payer: No Typology Code available for payment source | Attending: Psychiatry | Admitting: Psychiatry

## 2021-02-03 ENCOUNTER — Ambulatory Visit (HOSPITAL_COMMUNITY)
Admission: EM | Admit: 2021-02-03 | Discharge: 2021-02-04 | Disposition: A | Discharge status: Home / Self Care | Payer: No Typology Code available for payment source | Attending: Family | Admitting: Family

## 2021-02-03 ENCOUNTER — Other Ambulatory Visit: Payer: Self-pay

## 2021-02-03 VITALS — Ht 67.5 in | Wt 157.0 lb

## 2021-02-03 DIAGNOSIS — Z20822 Contact with and (suspected) exposure to covid-19: Secondary | ICD-10-CM | POA: Insufficient documentation

## 2021-02-03 DIAGNOSIS — Z9151 Personal history of suicidal behavior: Secondary | ICD-10-CM | POA: Insufficient documentation

## 2021-02-03 DIAGNOSIS — F419 Anxiety disorder, unspecified: Secondary | ICD-10-CM | POA: Insufficient documentation

## 2021-02-03 DIAGNOSIS — F332 Major depressive disorder, recurrent severe without psychotic features: Secondary | ICD-10-CM | POA: Insufficient documentation

## 2021-02-03 DIAGNOSIS — T71162A Asphyxiation due to hanging, intentional self-harm, initial encounter: Secondary | ICD-10-CM | POA: Diagnosis not present

## 2021-02-03 DIAGNOSIS — Z79899 Other long term (current) drug therapy: Secondary | ICD-10-CM | POA: Insufficient documentation

## 2021-02-03 LAB — COMPREHENSIVE METABOLIC PANEL
ALT: 19 U/L (ref 0–44)
AST: 19 U/L (ref 15–41)
Albumin: 4.2 g/dL (ref 3.5–5.0)
Alkaline Phosphatase: 58 U/L (ref 38–126)
Anion gap: 11 (ref 5–15)
BUN: 11 mg/dL (ref 6–20)
CO2: 25 mmol/L (ref 22–32)
Calcium: 9.5 mg/dL (ref 8.9–10.3)
Chloride: 100 mmol/L (ref 98–111)
Creatinine, Ser: 0.65 mg/dL (ref 0.44–1.00)
GFR, Estimated: >60 mL/min (ref >60)
Glucose, Bld: 87 mg/dL (ref 70–99)
Potassium: 4.5 mmol/L (ref 3.5–5.1)
Sodium: 136 mmol/L (ref 135–145)
Total Bilirubin: 0.8 mg/dL (ref 0.3–1.2)
Total Protein: 7.2 g/dL (ref 6.5–8.1)

## 2021-02-03 LAB — LIPID PANEL
Cholesterol: 178 mg/dL (ref 0–200)
HDL: 80 mg/dL (ref >40)
LDL Cholesterol: 84 mg/dL (ref 0–99)
Total CHOL/HDL Ratio: 2.2 RATIO
Triglycerides: 68 mg/dL (ref <150)
VLDL: 14 mg/dL (ref 0–40)

## 2021-02-03 LAB — RESP PANEL BY RT-PCR (FLU A&B, COVID) ARPGX2
Influenza A by PCR: NEGATIVE
Influenza B by PCR: NEGATIVE
SARS Coronavirus 2 by RT PCR: NEGATIVE

## 2021-02-03 LAB — URINALYSIS, ROUTINE W REFLEX MICROSCOPIC
Bilirubin Urine: NEGATIVE
Glucose, UA: NEGATIVE mg/dL
Hgb urine dipstick: NEGATIVE
Ketones, ur: NEGATIVE mg/dL
Leukocytes,Ua: NEGATIVE
Nitrite: NEGATIVE
Protein, ur: NEGATIVE mg/dL
Specific Gravity, Urine: 1.023 (ref 1.005–1.030)
pH: 5 (ref 5.0–8.0)

## 2021-02-03 LAB — CBC WITH DIFFERENTIAL/PLATELET
Abs Immature Granulocytes: 0.03 10*3/uL (ref 0.00–0.07)
Basophils Absolute: 0 10*3/uL (ref 0.0–0.1)
Basophils Relative: 0 %
Eosinophils Absolute: 0.1 10*3/uL (ref 0.0–0.5)
Eosinophils Relative: 1 %
HCT: 42.5 % (ref 36.0–46.0)
Hemoglobin: 14.2 g/dL (ref 12.0–15.0)
Immature Granulocytes: 0 %
Lymphocytes Relative: 31 %
Lymphs Abs: 2.6 10*3/uL (ref 0.7–4.0)
MCH: 30.1 pg (ref 26.0–34.0)
MCHC: 33.4 g/dL (ref 30.0–36.0)
MCV: 90 fL (ref 80.0–100.0)
Monocytes Absolute: 0.5 10*3/uL (ref 0.1–1.0)
Monocytes Relative: 6 %
Neutro Abs: 5.2 10*3/uL (ref 1.7–7.7)
Neutrophils Relative %: 62 %
Platelets: 315 10*3/uL (ref 150–400)
RBC: 4.72 MIL/uL (ref 3.87–5.11)
RDW: 12.4 % (ref 11.5–15.5)
WBC: 8.4 10*3/uL (ref 4.0–10.5)
nRBC: 0 % (ref 0.0–0.2)

## 2021-02-03 LAB — POCT URINE DRUG SCREEN - MANUAL ENTRY (I-SCREEN)
POC Amphetamine UR: NOT DETECTED
POC Buprenorphine (BUP): NOT DETECTED
POC Cocaine UR: NOT DETECTED
POC Marijuana UR: NOT DETECTED
POC Methadone UR: NOT DETECTED
POC Methamphetamine UR: NOT DETECTED
POC Morphine: NOT DETECTED
POC Oxazepam (BZO): NOT DETECTED
POC Oxycodone UR: NOT DETECTED
POC Secobarbital (BAR): NOT DETECTED

## 2021-02-03 LAB — HEMOGLOBIN A1C
Hgb A1c MFr Bld: 5 % (ref 4.8–5.6)
Mean Plasma Glucose: 96.8 mg/dL

## 2021-02-03 LAB — ETHANOL: Alcohol, Ethyl (B): <10 mg/dL (ref <10)

## 2021-02-03 LAB — POC SARS CORONAVIRUS 2 AG: SARSCOV2ONAVIRUS 2 AG: NEGATIVE

## 2021-02-03 LAB — MAGNESIUM: Magnesium: 1.9 mg/dL (ref 1.7–2.4)

## 2021-02-03 LAB — PREGNANCY, URINE: Preg Test, Ur: NEGATIVE

## 2021-02-03 LAB — TSH: TSH: 5.172 u[IU]/mL — ABNORMAL HIGH (ref 0.350–4.500)

## 2021-02-03 LAB — POC SARS CORONAVIRUS 2 AG -  ED: SARS Coronavirus 2 Ag: NEGATIVE

## 2021-02-03 MED ORDER — ACETAMINOPHEN 325 MG PO TABS: 650.0000 mg | ORAL_TABLET | Freq: Four times a day (QID) | ORAL | Status: DC | PRN

## 2021-02-03 MED ORDER — ESCITALOPRAM OXALATE 10 MG PO TABS
20.0000 mg | ORAL_TABLET | Freq: Every day | ORAL | Status: DC
  Administered 2021-02-03 – 2021-02-04 (×2): 20 mg via ORAL
  Filled 2021-02-03 (×2): qty 2

## 2021-02-03 MED ORDER — ALUM & MAG HYDROXIDE-SIMETH 200-200-20 MG/5ML PO SUSP: 30.0000 mL | ORAL | Status: DC | PRN

## 2021-02-03 MED ORDER — LEVOTHYROXINE SODIUM 25 MCG PO TABS
50.0000 ug | ORAL_TABLET | Freq: Every day | ORAL | Status: DC
  Administered 2021-02-04: 50 ug via ORAL
  Filled 2021-02-03: qty 2

## 2021-02-03 MED ORDER — NORETHIN ACE-ETH ESTRAD-FE 1-20 MG-MCG PO TABS: 1.0000 | ORAL_TABLET | Freq: Every day | ORAL | Status: DC

## 2021-02-03 MED ORDER — MAGNESIUM HYDROXIDE 400 MG/5ML PO SUSP: 30.0000 mL | Freq: Every day | ORAL | Status: DC | PRN

## 2021-02-03 NOTE — Progress Notes (Signed)
Pt is asleep. Respirations are even and unlabored. No signs of acute distress noted.  Pt's safety is maintained. 

## 2021-02-03 NOTE — H&P (Signed)
Behavioral Health Medical Screening Exam    Total Time spent with patient: 45 minutes  Olivia Sellers is a 30 y.o female, seen by this provider with TTS counselor face to face, presents to [facility] as voluntary walk-in accompanied by no one.  Reports "I went to my therapist today and I revealed that I had a second suicide attempt on Father's Day "reports that she tried to hang herself on January 16, 2021 with a noose in the woods.  Stressors over the past year, she had a miscarriage earlier this year, she is a Administrator, [employer], works night shift, recently moved her parents from [place] with her savings account money that she had plans for her future.   She is alert and oriented to person place time situation and year, has good eye contact throughout interview, sitting calmly in exam room.  She is cooperative, describes her mood as irritable, tearful at times, anger, anxious and depressed.  Mood is congruent with affect.  Speech normal, volume normal.  Thought processes are intact.  Denies auditory and visual hallucinations, endorses suicidal ideation, reports that she does not really want to die,however, she reports that she still has the noose in her car.  Endorses self injurious behaviors, she cuts herself, scars are visible on her arms.  Denies homicidal ideation.  Reports that she does not use substances however she drinks with the goal of getting drunk, last beer was 1 week ago, last episode of getting drunk was 3 weeks ago.  She reports emotional abuse, reports having a "messed up family "sleep is disturbed and variable due to night shift worker, appetite is decreased.  She is employed as a Engineer, civil (consulting).  Permission given to contact [name], her boyfriend with whom she lives with, message left for [name] to return call, left message for sister, [name] return call and reports that she does not see her sister but every 1 to 2 months, and they recently had a fight.  Concerned that noose is in car,  unable to secure safety plan due to inability to contact her boyfriend.    Psychiatric Specialty Exam:  Presentation  General Appearance:  Appropriate for Environment Eye Contact: Good Speech: Clear and Coherent Speech Volume: Normal Handedness: Right  Mood and Affect  Mood: Anxious; Depressed; Worthless; Angry Affect: Congruent  Thought Process  Thought Processes: Goal Directed; Coherent Descriptions of Associations:Intact Orientation:Full (Time, Place and Person) Thought Content:Logical History of Schizophrenia/Schizoaffective disorder:No data recorded Duration of Psychotic Symptoms:No data recorded Hallucinations:Hallucinations: None Ideas of Reference:None Suicidal Thoughts:Suicidal Thoughts: Yes, Passive SI Passive Intent and/or Plan: Without Intent; Without Plan; With Means to Carry Out Homicidal Thoughts:Homicidal Thoughts: No  Sensorium  Memory: Immediate Good; Recent Good; Remote Good Judgment: Good Insight: Good  Executive Functions  Concentration: Good Attention Span: Good Recall: Good Fund of Knowledge: Good Language: Good  Psychomotor Activity  Psychomotor Activity: Psychomotor Activity: Normal  Assets  Assets: Communication Skills; Desire for Improvement; Financial Resources/Insurance; Housing; Intimacy; Physical Health; Resilience; Social Support; Talents/Skills; Transportation; Vocational/Educational; Leisure Time  Sleep  Sleep: Sleep: Fair   Physical Exam: Physical Exam Cardiovascular:     Rate and Rhythm: Normal rate and regular rhythm.     Heart sounds: Normal heart sounds.  Pulmonary:     Effort: Pulmonary effort is normal.     Breath sounds: Normal breath sounds.  Skin:    General: Skin is warm and dry.  Neurological:     Mental Status: She is alert and oriented to person, place, and  time.  Psychiatric:        Attention and Perception: Attention normal.        Mood and Affect: Mood is anxious and depressed.  Affect is flat.        Speech: Speech normal.        Behavior: Behavior is cooperative.        Thought Content: Thought content is not paranoid or delusional. Thought content includes suicidal ideation. Thought content does not include homicidal ideation. Thought content does not include homicidal or suicidal (attempted hanging on 01/16/21) plan.        Cognition and Memory: Cognition normal.   Review of Systems  Constitutional:  Negative for chills and fever.  Respiratory:  Negative for shortness of breath.   Cardiovascular:  Negative for chest pain.  Gastrointestinal:  Positive for nausea. Negative for abdominal pain and vomiting.  Neurological:  Negative for headaches.  Psychiatric/Behavioral:  Positive for depression, substance abuse (alcohol use/abuse) and suicidal ideas. Negative for hallucinations and memory loss. The patient is nervous/anxious and has insomnia (night shift worker).   Blood pressure 124/82, pulse 93, temperature 98.8 F (37.1 C), temperature source Oral, resp. rate 18. There is no height or weight on file to calculate BMI.  Musculoskeletal: Strength & Muscle Tone: within normal limits Gait & Station: normal Patient leans: N/A   Recommendations:  Based on my evaluation the patient does not appear to have an emergency medical condition. Recommend overnight observation at [facility] to secure safety plan. TTS counselor to initiate Partial hospitalization program ;  TTS counselor notified Doran Heater, NP for bed availability at [facility]. Patient agreeable to plan. EMTALA completed. Will transport via safe transport.   Novella Olive, NP 02/03/2021, 12:52 PM

## 2021-02-03 NOTE — Progress Notes (Signed)
Pt admitted to OBS bed 2 due to SI and SA behaviors. Pt is alert and oriented with flat affect. Pt is ambulatory and is oriented to staff/unit. Pt was cooperative with skin assessment. Healed lacerations noted on bilateral arms. Pt denies pain, SI/HI/AVH at this time. Staff will monitor for pt's safety.

## 2021-02-03 NOTE — Assessment & Plan Note (Signed)
-  she attempted to [facility] herself of Father's Day (01/16/21) -she has therapy appointment at 10 AM -considered adding wellbutrin, but will defer to psychiatry -Urgent referral to psychiatry

## 2021-02-03 NOTE — Progress Notes (Signed)
Patient signed belongings consent for her significant other, [place]. To pick up her car key. Patients car is located at the [facility] hospital at this time.

## 2021-02-03 NOTE — Telephone Encounter (Signed)
Spoke with pt by phone. 

## 2021-02-03 NOTE — BH Assessment (Addendum)
Comprehensive Clinical Assessment (CCA) Note  02/03/2021 Olivia Sellers [id number]  Olivia assessment completed. Per Olivia Sellers, Olivia Bodo, Olivia Sellers, patient recommended for a continous observation admission to the Olivia Sellers. The accepting Sellers at the Olivia Sellers is Olivia Harm, Olivia Sellers. Patient will be re-evaluated by psychiatry in the morning. Olivia [facility] provided updates of patient's disposition. The nurse's at the Olivia Sellers are ready to as/sume care for this patient. Olivia Sellers to arrange transport to the facility. Patient is expected to discharge 02/04/2021. Therefore, sent outpatient Sellers Olivia Sellers and Olivia Sellers emails requesting follow a follow up appointment for this patient to start Partial Hospitalization. Olivia Sellers ([name]) completed and patient is considered "High Risk". Patient is cooperative with treatment. Therefore, no 1:1 sitter precautions recommendations required at this time.   The patient demonstrates the following risk factors for suicide: Chronic risk factors for suicide include: Severe episode of recurrent major depressive disorder, without psychotic features (HCC) Acute risk factors for suicide include: recent suicide attempt by attempting to hang herself 01/16/2021. Protective factors for this patient include: responsibility to others (boyfriend and sister). Considering these factors, the overall suicide risk at this point appears to be high. Patient is not appropriate for outpatient follow up.    Olivia Sellers ([name]) completed and patient is considered "High Risk". Patient is cooperative with treatment. Therefore, no 1:1 sitter precautions recommendations required at this time.   Flowsheet Row ED from 02/03/2021 in [facility] Most recent reading at 02/03/2021 11:44 PM Video Visit from 02/03/2021 in [facility] Most recent reading at 02/03/2021  8:45 AM ED [facility]/23/2020 [facility]  [facility] Most recent reading at 08/23/2020 11:06 AM  [name] RISK CATEGORY High Risk High Risk No Risk        Chief Complaint:  Chief Complaint  Patient presents with   urgent emergent eval   Visit Diagnosis: Severe episode of recurrent major depressive disorder, without psychotic features      Olivia Sellers is a 30 y.o female, seen by  Olivia Sellers to Sellers, in addition to the [facility] Olivia Bodo, Olivia Sellers). She presents to [facility] as a voluntary walk-in accompanied by no one. She drove herself to [facility] after being referred by her Olivia Sellers she saw for the first time today.  Reports "I went to my Olivia Sellers today and I revealed that I had a second suicide attempt on Father's Day "reports that she tried to hang herself on January 16, 2021 with a noose in the woods. She reports making grave attempts to proceed with her suicide attempt. However, after a moment of lucidity she thought of protective factors such as her boyfriend and sister.  Stressors over the past year, she had a miscarriage earlier this year, she is a Administrator, Civil Service, works night shift with difficult patients, recently moved her parents from [place], mainly because her dad was very ill. She ended up spending her savings, $18,000 to care for mainly who father who has significant medical complications. She had plans with her money to "get married, have a baby, purchase a house, etc.). However, unable to do so because of her responsibility to care for her father.   Today patient denies any thoughts of suicide. However, acknowledges that her suicidal ideations have been intermittent for many years. Last suicide attempted by hanging was approximately 1 month ago (Fathers Day 6/19/22_, noose remains in the trunk of her car.  She reports the trigger for the suicide  attempt was "a really bad night at work with a combative patient and all the other stressors that I've endured in the past several months".  She endorses 1 prior episode of  suicidal ideation at age 41, attempted to slip her wrist.  She denies access to guns. Concerned that noose is in car that used to initiate a suicide attempt 01/16/2021, unable to secure safety plan due to inability to contact her boyfriend. Sellers and Clinician would like someone identified as patient's support to come an remove the noose from patient's care prior to discharging her home.  Denies history of self mutilating behaviors.   She denies homicidal ideations. Denies auditory and visual hallucinations.  There is no evidence of delusional thought content and no indication that she is responding to internal stimuli.   She has been diagnosed with anxiety and depression.  She is prescribed Lexapro by her primary care Sellers.  She reports compliance with Lexapro. She does not have a psychiatrist. She endorses family history of mental illness including her father who has been diagnosed with bipolar schizoaffective disorder. She resides in [place] with her boyfriend of 3 years. No hx of abuse and/or trauma.   She endorses alcohol use.  States she "only drinks to get drunk."  She reports drinking 2-3 beers once per week or less.  She denies substance use aside from alcohol.  She is alert and oriented to person place time situation and year, has good Olivia contact throughout interview, sitting calmly in exam room.  She is cooperative, describes her mood as irritable, tearful at times, anger, anxious and depressed.  Mood is congruent with affect.  Speech normal, volume normal.  Thought processes are intact.  Collateral Note by the [facility] Sellers: "Permission given to contact Olivia Sellers, her boyfriend with whom she lives with, message left for Olivia Sellers to return call, left message for sister, Olivia Sellers return call and reports that she does not see her sister but every 1 to 2 months, and they recently had a fight.:    CCA Screening, Triage and Referral (STR)  Patient Reported Information How did you  hear about [place]? No data recorded What Is the Reason for Your Visit/Call Today? Referred by Olivia Sellers after notifying Olivia Sellers during her visit today that she made a suicide attempt 6/19  How Long Has This Been Causing You Problems? > than 6 months  What Do You Feel Would Help You the Most Today? Treatment for Depression or other mood problem; Medication(s)   Have You Recently Had Any Thoughts About Hurting Yourself? No  Are You Planning to Commit Suicide/Sellers Yourself At This time? No   Have you Recently Had Thoughts About Hurting Someone Karolee Ohs? No  Are You Planning to Sellers Someone at This Time? No  Explanation: No data recorded  Have You Used Any Alcohol or Drugs in the Past 24 Hours? No  How Long Ago Did You Use Drugs or Alcohol? No data recorded What Did You Use and How Much? No data recorded  Do You Currently Have a Olivia Sellers/Psychiatrist? Yes  Name of Olivia Sellers/Psychiatrist: First visit with Olivia Sellers today; 02/03/2021   Have You Been Recently Discharged From Any Office Practice or Programs? No  Explanation of Discharge From Practice/Program: No data recorded    CCA Screening Triage Referral Assessment Type of Contact: Sellers-to-Sellers  Telemedicine Service Delivery:   Is this Initial or Reassessment? No data recorded Date Telepsych consult ordered in [facility]:  No data recorded Time Telepsych consult ordered in [facility]:  No  data recorded [facility]  [facility]   Collateral Involvement: Patient provided verbal consent to speak with he sister and/or boyfriend. The Sellers spoke to the sister...see providers note   Does Patient Have a Court Appointed Legal Guardian? No data recorded Name and Contact of Legal Guardian: No data recorded If Minor and Not Living with Parent(s), Who has Custody? No data recorded Is CPS involved or ever been involved? Never  Is APS involved or ever been involved?  Never   Patient Determined To Be At Risk for Sellers To Self or Others Based on Review of Patient Reported Information or Presenting Complaint? No  Method: No data recorded Availability of Means: No data recorded Intent: No data recorded Notification Required: No data recorded Additional Information for Danger to Others Potential: No data recorded Additional Comments for Danger to Others Potential: No data recorded Are There Guns or Other Weapons in Your Home? No data recorded Types of Guns/Weapons: No data recorded Are These Weapons Safely Secured?                            No data recorded Who Could Verify You Are Able To Have These Secured: No data recorded Do You Have any Outstanding Charges, Pending Court Dates, Parole/Probation? No data recorded Contacted To Inform of Risk of Sellers To Self or Others: No data recorded   Does Patient Present under Involuntary Commitment? No  IVC Papers Initial File Date: No data recorded  [place]   Patient Currently Receiving the Following Services: Individual Therapy   Determination of Need: Urgent (48 hours)   Options For Referral: Partial Hospitalization; Intensive Outpatient Therapy; Medication Management     CCA Biopsychosocial Patient Reported Schizophrenia/Schizoaffective Diagnosis in Past: No   Strengths: unknown   Mental Health Symptoms Depression:   Difficulty Concentrating; Hopelessness; Fatigue; Change in energy/activity; Increase/decrease in appetite   Duration of Depressive symptoms:  Duration of Depressive Symptoms: Greater than two weeks   Mania:   None   Anxiety:    -- (Isolating)   Psychosis:   None   Duration of Psychotic symptoms:    Trauma:   N/A   Obsessions:   N/A   Compulsions:  No data recorded  Inattention:   N/A   Hyperactivity/Impulsivity:   N/A   Oppositional/Defiant Behaviors:   N/A   Emotional Irregularity:   N/A   Other Mood/Personality Symptoms:    unknown    Mental Status Exam Appearance and self-care  Stature:   Average   Weight:   Average weight   Clothing:   Neat/clean   Grooming:   Normal   Cosmetic use:   Age appropriate   Posture/gait:   Normal   Motor activity:  No data recorded  Sensorium  Attention:   Normal   Concentration:   Normal   Orientation:   X5   Recall/memory:   Normal   Affect and Mood  Affect:   Depressed   Mood:   Depressed   Relating  Olivia contact:   Normal   Facial expression:   Anxious   Attitude toward examiner:   Cooperative   Thought and Language  Speech flow:  Clear and Coherent   Thought content:   Appropriate to Mood and Circumstances   Preoccupation:   Other (Comment) (intermittent suicidal ideations)   Hallucinations:   None   Organization:  No data recorded  Executive  Functions  Fund of Knowledge:   Good   Intelligence:   Average   Abstraction:   Normal   Judgement:   Fair   Dance movement psychotherapist:   Adequate   Insight:   Good   Decision Making:   Normal   Social Functioning  Social Maturity:   Impulsive (hx of impullsive beheaviors evidenced by suicide attempt)   Social Judgement:   Normal   Stress  Stressors:   Other (Comment); Transitions; Work; Surveyor, minerals:   Normal   Skill Deficits:   None   Supports:   Family     Religion: Religion/Spirituality Are You A Religious Person?: No (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 Patient's Job has Been Impacted by Current Illness: No Has Patient ever Been in the [employer]?: No  Education: Education Is Patient Currently Olivia Sellers School?: No Last Grade Completed:  (nursing school) Did You Attend College?: No Did You Have An Individualized Education Program (IIEP): No Did You Have Any Difficulty At School?: No Patient's Education Has Been Impacted by Current Illness: No   CCA Family/Childhood History Family and Relationship History: Family history Marital status: Single Does patient have children?: No  Childhood History:  Childhood History By whom was/is the patient raised?: Both parents Did patient suffer from severe childhood neglect?: No Has patient ever been sexually abused/assaulted/raped as an adolescent or adult?: No Was the patient ever a victim of a crime or a disaster?: No Witnessed domestic violence?: No Has patient been affected by domestic violence as an adult?: No  Child/Adolescent Assessment:     CCA Substance Use Alcohol/Drug Use: Alcohol / Drug Use Pain Medications: SEE MAR Prescriptions: SEE MAR Over the Counter: SEE MAR History of alcohol / drug use?: Yes ("I drink to get drunk occasionally") Substance #1 Name of Substance 1: Alcohol 1 - Age of First Use: 30 yrs old 1 - Amount (size/oz): "I drink to get drunk" 1 - Frequency: varies 1 - Duration: on-gonig 1 - Last Use / Amount: Last week reports drinking 1 beer; 3 weeks she drank to get drunk 1 - Method of Aquiring: store purchase 1- Route of Use: oral                       ASAM's:  Six Dimensions of Multidimensional Assessment  Dimension 1:  Acute Intoxication and/or Withdrawal Potential:      Dimension 2:  Biomedical Conditions and Complications:      Dimension 3:  Emotional, Behavioral, or Cognitive Conditions and Complications:      Dimension 4:  Readiness to Change:     Dimension 5:  Relapse, Continued use, or Continued Problem Potential:     Dimension 6:  Recovery/Living Environment:     ASAM Severity Score:    ASAM Recommended Level of Treatment:     Substance use Disorder (SUD) Substance Use Disorder (SUD)  Checklist Symptoms of Substance Use: Continued use despite  having a persistent/recurrent physical/psychological problem caused/exacerbated by use  Recommendations for Services/Supports/Treatments: Recommendations for Services/Supports/Treatments Recommendations For Services/Supports/Treatments: Individual Therapy, IOP (Intensive Outpatient Program), Partial Hospitalization, Medication Management  Discharge Disposition:    DSM5 Diagnoses: Patient Active Problem List   Diagnosis Date Noted   Suicide attempt by hanging (HCC) 02/03/2021   Nausea 08/30/2020   Missed period 08/30/2020   Abdominal cramping 08/30/2020   Pregnancy examination or test, negative result 08/30/2020   Dizzy spells 08/30/2020   Acquired hypothyroidism 08/26/2020   Orthostatic hypotension 08/26/2020   Annual visit for general adult medical examination with abnormal findings 06/18/2020   Other fatigue 06/18/2020   Missed menses 06/18/2020   Depression, major, single episode, moderate (HCC) 09/23/2019   Situational anxiety 06/18/2019   Shifting sleep-work schedule 06/18/2019     Referrals to Alternative Service(s): Referred to Alternative Service(s):   Place:   Date:   Time:    Referred to Alternative Service(s):   Place:   Date:   Time:    Referred to Alternative Service(s):   Place:   Date:   Time:    Referred to Alternative Service(s):   Place:   Date:   Time:     Melynda Ripple, Sellers

## 2021-02-03 NOTE — ED Notes (Signed)
Per report at shift change pt boyfriend was reported to have gone to Ms. Doyle's car and remove the noosed rope from her auto.

## 2021-02-03 NOTE — Progress Notes (Signed)
Acute Office Visit  Subjective:    Patient ID: Olivia Sellers, female    DOB: 05/19/1991, 30 y.o.   MRN: [id number]  Chief Complaint  Patient presents with   Anxiety    Worsening over the past 2 months. Is still taking her Lexapro, doesn't seem to be helping as much.   Depression    Worsening over the past 2 months. Is still taking her Lexapro, doesn't seem to be helping as much.    Anxiety Symptoms include nervous/anxious behavior and suicidal ideas.    Depression        Associated symptoms include suicidal ideas.  Past medical history includes anxiety.   Patient is in today for anx/depression.  She states that she is having suicidal thoughts. She had a suicide attempt on Father's Day (01/16/21). She tried to hang herself.  She contracts for safety today and is going to therapy at 10 AM.  Past Medical History:  Diagnosis Date   Anemia    Anxiety    Cholelithiasis    Depression    Encounter for gynecological examination with Papanicolaou smear of cervix 08/20/2019   Encounter for initial prescription of contraceptive pills 02/19/2020   Encounter for Nexplanon removal 02/19/2020   General counseling and advice for contraceptive management 08/20/2019   Nexplanon insertion 09/09/2019   Pilonidal cyst    hx of    Past Surgical History:  Procedure Laterality Date   CHOLECYSTECTOMY N/A 08/22/2019   Procedure: LAPAROSCOPIC CHOLECYSTECTOMY;  Surgeon: Ralene Ok, MD;  Location: [facility];  Service: General;  Laterality: N/A;   SURGERY OF LIP     suture to lip at age 76   wisdom teeth removed      Family History  Problem Relation Age of Onset   Panic disorder Mother    Drug abuse Mother    Alcohol abuse Mother    Depression Mother    COPD Mother    Bipolar disorder Father    Alcohol abuse Father     Social History   Socioeconomic History   Marital status: Single    Spouse name: Not on file   Number of children: Not on file   Years of education:  Not on file   Highest education level: Associate degree: academic program  Occupational History   Occupation: Therapist, sports  Tobacco Use   Smoking status: Never   Smokeless tobacco: Never  Vaping Use   Vaping Use: Never used  Substance and Sexual Activity   Alcohol use: Yes    Comment: socially    Drug use: Never   Sexual activity: Yes    Birth control/protection: Pill  Other Topics Concern   Not on file  Social History Narrative   Lives alone currently   No pets      Enjoys drawing      Diet: was eating fast food a lot, eating more veggies and fruits now, beans-for vegetarian like    Caffeine: 3 cups a coffee, no energy drinks, some sweet tea   Water: 2 cups -does drink milk daily      Wears seat belt   Wears sun protection   Smoke detectors    Does not use phone while driving       Social Determinants of Radio broadcast assistant Strain: Low Risk    Difficulty of Paying Living Expenses: Not hard at all  Food Insecurity: No Food Insecurity   Worried About Charity fundraiser in the Last Year: Never  true   Ran Out of Food in the Last Year: Never true  Transportation Needs: No Transportation Needs   Lack of Transportation (Medical): No   Lack of Transportation (Non-Medical): No  Physical Activity: Insufficiently Active   Days of Exercise per Week: 2 days   Minutes of Exercise per Session: 60 min  Stress: No Stress Concern Present   Feeling of Stress : Not at all  Social Connections: Socially Isolated   Frequency of Communication with Friends and Family: Once a week   Frequency of Social Gatherings with Friends and Family: Once a week   Attends Religious Services: Never   Marine scientist or Organizations: No   Attends Music therapist: Never   Marital Status: Living with partner  Intimate Partner Violence: Not At Risk   Fear of Current or Ex-Partner: No   Emotionally Abused: No   Physically Abused: No   Sexually Abused: No    Outpatient  Medications Prior to Visit  Medication Sig Dispense Refill   escitalopram (LEXAPRO) 20 MG tablet Take 1 tablet (20 mg total) by mouth daily. 90 tablet 1   levothyroxine (SYNTHROID) 50 MCG tablet Take 1 tablet (50 mcg total) by mouth daily before breakfast. 30 tablet 2   norethindrone-ethinyl estradiol (LOESTRIN FE) 1-20 MG-MCG tablet TAKE 1 TABLET BY MOUTH ONCE DAILY. 140 tablet 0   ondansetron (ZOFRAN-ODT) 4 MG disintegrating tablet TAKE 1 TABLET (4 MG TOTAL) BY MOUTH EVERY 6 (SIX) HOURS AS NEEDED FOR NAUSEA. (Patient not taking: Reported on 02/03/2021) 20 tablet 0   No facility-administered medications prior to visit.    No Known Allergies  Review of Systems  Constitutional: Negative.   Psychiatric/Behavioral:  Positive for depression, dysphoric mood, self-injury and suicidal ideas. The patient is nervous/anxious.       Objective:    Physical Exam  Ht 5' 7.5" (1.715 m)   Wt 157 lb (71.2 kg)   LMP 12/29/2020 (Approximate)   BMI 24.23 kg/m  Wt Readings from Last 3 Encounters:  02/03/21 157 lb (71.2 kg)  11/25/20 153 lb 1.9 oz (69.5 kg)  09/14/20 152 lb (68.9 kg)    Health Maintenance Due  Topic Date Due   COVID-19 Vaccine (3 - Booster for Moderna series) 08/30/2020    There are no preventive care reminders to display for this patient.   Lab Results  Component Value Date   TSH 3.400 11/25/2020   Lab Results  Component Value Date   WBC 8.4 08/23/2020   HGB 14.6 08/23/2020   HCT 42.4 08/23/2020   MCV 86.5 08/23/2020   PLT 306 08/23/2020   Lab Results  Component Value Date   NA 136 11/25/2020   K 4.1 11/25/2020   CO2 23 11/25/2020   GLUCOSE 93 11/25/2020   BUN 9 11/25/2020   CREATININE 0.68 11/25/2020   BILITOT 0.3 08/30/2020   ALKPHOS 73 08/30/2020   AST 19 08/30/2020   ALT 24 08/30/2020   PROT 6.9 08/30/2020   ALBUMIN 4.4 08/30/2020   CALCIUM 9.4 11/25/2020   ANIONGAP 13 08/09/2019   EGFR 121 11/25/2020   Lab Results  Component Value Date   CHOL 189  06/18/2020   Lab Results  Component Value Date   HDL 81 06/18/2020   Lab Results  Component Value Date   LDLCALC 97 06/18/2020   Lab Results  Component Value Date   TRIG 57 06/18/2020   Lab Results  Component Value Date   CHOLHDL 2.3 06/18/2020   Lab  Results  Component Value Date   HGBA1C 5.3 06/18/2020       Assessment & Plan:   Problem List Items Addressed This Visit       Other   Suicide attempt by hanging ([place]) - Primary    -she attempted to [facility] herself of Father's Day (01/16/21) -she has therapy appointment at 21 AM -considered adding wellbutrin, but will defer to psychiatry -Urgent referral to psychiatry       Relevant Orders   Ambulatory referral to Psychiatry     No orders of the defined types were placed in this encounter.  Date:  02/03/2021   Location of Patient: Home Location of Provider: Office Consent was obtain for visit to be over via telehealth. I verified that I am speaking with the correct person using two identifiers.  I connected with  Juan Quam on 02/03/21 via telephone and verified that I am speaking with the correct person using two identifiers.   I discussed the limitations of evaluation and management by telemedicine. The patient expressed understanding and agreed to proceed.  Time spent: 6 minutes   Noreene Larsson, NP

## 2021-02-03 NOTE — ED Provider Notes (Signed)
Behavioral Health Admission H&P [facility] & OBS)  Date: 02/03/21 Patient Name: Olivia Sellers MRN: [id number] Chief Complaint:  Chief Complaint  Patient presents with   urgent emergent eval      Diagnoses:  Final diagnoses:  Severe episode of recurrent major depressive disorder, without psychotic features [facility])    HPI: Patient transported from [facility] behavioral health to [facility] behavioral health for observation admission, patient voluntary at this time.  Olivia Sellers is assessed by nurse practitioner.  She is seated in assessment area, no apparent distress.  She is alert and oriented, pleasant and cooperative during assessment.  She was directed to [facility] behavioral health by outpatient therapy, attended initial outpatient therapist visit earlier today.  She reports feeling suicidal earlier today, denies suicidal ideations at this time.  She states "I am just focused on getting some help and feeling better."  Olivia Sellers attempted suicide by hanging approximately 1 month ago, noose remains in the trunk of her car.  She reports the trigger for the suicide attempt was "a really bad night at work with a combative patient."  She endorses 1 prior episode of suicidal ideation at age 30.  At that time she did not act on suicidal ideation but states she "slept with a gun in my bed and thought about it."  She endorses nonsuicidal, self-harm behavior by cutting.  She reports last episode of cutting in February 2022.  She denies homicidal ideations.  She denies both auditory and visual hallucinations.  There is no evidence of delusional thought content and no indication that she is responding to internal stimuli.  She has been diagnosed with anxiety and depression.  She is prescribed Lexapro by her primary care provider.  She reports compliance with Lexapro.  Initial outpatient counseling appointment completed today.  She endorses family history of mental illness including her father who has been diagnosed  with bipolar schizoaffective disorder.  Olivia Sellers resides in [place] with her boyfriend of 3 years.  She denies access to weapons.  She is employed as a Marine scientist in a hospital setting.  She reports working night shift for many years, this causes her sleep to be variable.  She states she sleeps from 1 hour to 12 hours per night.  She has accepted a new position that begins on 7/16, hours are day shift.  She reports average appetite.  She endorses alcohol use.  States she "only drinks to get drunk."  She reports drinking 2-3 beers once per week or less.  She denies substance use aside from alcohol.      PHQ 2-9:  Flowsheet Row Video Visit from 02/03/2021 in [facility] Office Visit from 11/25/2020 in [facility] Office Visit from 06/18/2020 in [facility]  Thoughts that you would be better off dead, or of hurting yourself in some way Nearly every day Not at all Not at all  PHQ-9 Total Score 27 0 7       Flowsheet Row Video Visit from 02/03/2021 in [facility] ED from 08/23/2020 in [facility] [place] High Risk No Risk        Total Time spent with patient: 30 minutes  Musculoskeletal  Strength & Muscle Tone: within normal limits Gait & Station: normal Patient leans: N/A  Psychiatric Specialty Exam  Presentation General Appearance: Appropriate for Environment; Casual  Eye Contact:Good  Speech:Clear and Coherent; Normal Rate  Speech Volume:Normal  Handedness:Right   Mood and Affect  Mood:Depressed  Affect:Congruent; Depressed  Thought Process  Thought Processes:Coherent; Goal Directed  Descriptions of Associations:Intact  Orientation:Full (Time, Place and Person)  Thought Content:Logical; WDL    Hallucinations:Hallucinations: None  Ideas of Reference:None  Suicidal Thoughts:Suicidal Thoughts: No SI Passive Intent and/or Plan: Without Intent; Without Plan; With Means to [place]  Homicidal Thoughts:Homicidal Thoughts: No   Sensorium  Memory:Immediate Good; Recent Good; Remote Good  Judgment:Good  Insight:Good   Executive Functions  Concentration:Good  Attention Span:Good  [place] of Knowledge:Good  Language:Good   Psychomotor Activity  Psychomotor Activity:Psychomotor Activity: Normal   Assets  Assets:Communication Skills; Desire for Improvement; Financial Resources/Insurance; Housing; Intimacy; Leisure Time; Physical Health; Resilience; Talents/Skills; Social Support; Transport planner; Vocational/Educational   Sleep  Sleep:Sleep: Fair   Nutritional Assessment (For OBS and [facility] admissions only) Has the patient had a weight loss or gain of 10 pounds or more in the last 3 months?: No Has the patient had a decrease in food intake/or appetite?: No Does the patient have dental problems?: No Does the patient have eating habits or behaviors that may be indicators of an eating disorder including binging or inducing vomiting?: No Has the patient recently lost weight without trying?: No Has the patient been eating poorly because of a decreased appetite?: No Malnutrition Screening Tool Score: 0   Physical Exam Vitals and nursing note reviewed.  Constitutional:      Appearance: Normal appearance. She is well-developed and normal weight.  HENT:     Head: Normocephalic and atraumatic.     Nose: Nose normal.  Cardiovascular:     Rate and Rhythm: Normal rate.  Pulmonary:     Effort: Pulmonary effort is normal.  Musculoskeletal:        General: Normal range of motion.     Cervical back: Normal range of motion.  Neurological:     Mental Status: She is alert and oriented to person, place, and time.  Psychiatric:        Attention and Perception: Attention and perception normal.        Mood and Affect: Mood is depressed. Affect is tearful.        Speech: Speech normal.        Behavior: Behavior normal. Behavior is cooperative.         Thought Content: Thought content normal.        Cognition and Memory: Cognition and memory normal.        Judgment: Judgment normal.   Review of Systems  Constitutional: Negative.   HENT: Negative.    Eyes: Negative.   Respiratory: Negative.    Cardiovascular: Negative.   Gastrointestinal: Negative.   Genitourinary: Negative.   Musculoskeletal: Negative.   Skin: Negative.   Neurological: Negative.   Endo/Heme/Allergies: Negative.   Psychiatric/Behavioral:  Positive for depression.    Blood pressure 138/90, pulse 88, temperature 99 F (37.2 C), temperature source Oral, resp. rate 16, SpO2 98 %. There is no height or weight on file to calculate BMI.  Past Psychiatric History: Anxiety, depression  Is the patient at risk to self? No  Has the patient been a risk to self in the past 6 months? Yes .    Has the patient been a risk to self within the distant past? Yes   Is the patient a risk to others? No   Has the patient been a risk to others in the past 6 months? No   Has the patient been a risk to others within the distant past? No   Past Medical  History:  Past Medical History:  Diagnosis Date   Anemia    Anxiety    Cholelithiasis    Depression    Encounter for gynecological examination with Papanicolaou smear of cervix 08/20/2019   Encounter for initial prescription of contraceptive pills 02/19/2020   Encounter for Nexplanon removal 02/19/2020   General counseling and advice for contraceptive management 08/20/2019   Nexplanon insertion 09/09/2019   Pilonidal cyst    hx of    Past Surgical History:  Procedure Laterality Date   CHOLECYSTECTOMY N/A 08/22/2019   Procedure: LAPAROSCOPIC CHOLECYSTECTOMY;  Surgeon: Ralene Ok, MD;  Location: [facility];  Service: General;  Laterality: N/A;   SURGERY OF LIP     suture to lip at age 58   wisdom teeth removed      Family History:  Family History  Problem Relation Age of Onset   Panic disorder Mother    Drug  abuse Mother    Alcohol abuse Mother    Depression Mother    COPD Mother    Bipolar disorder Father    Alcohol abuse Father     Social History:  Social History   Socioeconomic History   Marital status: Single    Spouse name: Not on file   Number of children: Not on file   Years of education: Not on file   Highest education level: Associate degree: academic program  Occupational History   Occupation: Therapist, sports  Tobacco Use   Smoking status: Never   Smokeless tobacco: Never  Vaping Use   Vaping Use: Never used  Substance and Sexual Activity   Alcohol use: Yes    Comment: socially    Drug use: Never   Sexual activity: Yes    Birth control/protection: Pill  Other Topics Concern   Not on file  Social History Narrative   Lives alone currently   No pets      Enjoys drawing      Diet: was eating fast food a lot, eating more veggies and fruits now, beans-for vegetarian like    Caffeine: 3 cups a coffee, no energy drinks, some sweet tea   Water: 2 cups -does drink milk daily      Wears seat belt   Wears sun protection   Smoke detectors    Does not use phone while driving       Social Determinants of Radio broadcast assistant Strain: Low Risk    Difficulty of Paying Living Expenses: Not hard at all  Food Insecurity: No Food Insecurity   Worried About Charity fundraiser in the Last Year: Never true   [place] in the Last Year: Never true  Transportation Needs: No Transportation Needs   Lack of Transportation (Medical): No   Lack of Transportation (Non-Medical): No  Physical Activity: Insufficiently Active   Days of Exercise per Week: 2 days   Minutes of Exercise per Session: 60 min  Stress: No Stress Concern Present   Feeling of Stress : Not at all  Social Connections: Socially Isolated   Frequency of Communication with Friends and Family: Once a week   Frequency of Social Gatherings with Friends and Family: Once a week   Attends Religious Services: Never    Marine scientist or Organizations: No   Attends Archivist Meetings: Never   Marital Status: Living with partner  Intimate Partner Violence: Not At Risk   Fear of Current or Ex-Partner: No   Emotionally Abused:  No   Physically Abused: No   Sexually Abused: No    SDOH:  SDOH Screenings   Alcohol Screen: Low Risk    Last Alcohol Screening Score (AUDIT): 1  Depression (PHQ2-9): Medium Risk   PHQ-2 Score: 27  Financial Resource Strain: Low Risk    Difficulty of Paying Living Expenses: Not hard at all  Food Insecurity: No Food Insecurity   Worried About Charity fundraiser in the Last Year: Never true   Ran Out of Food in the Last Year: Never true  Housing: Low Risk    Last Housing Risk Score: 0  Physical Activity: Insufficiently Active   Days of Exercise per Week: 2 days   Minutes of Exercise per Session: 60 min  Social Connections: Socially Isolated   Frequency of Communication with Friends and Family: Once a week   Frequency of Social Gatherings with Friends and Family: Once a week   Attends Religious Services: Never   Marine scientist or Organizations: No   Attends Music therapist: Never   Marital Status: Living with partner  Stress: No Stress Concern Present   Feeling of Stress : Not at all  Tobacco Use: Low Risk    Smoking Tobacco Use: Never   Smokeless Tobacco Use: Never  Transportation Needs: No Transportation Needs   Lack of Transportation (Medical): No   Lack of Transportation (Non-Medical): No    Last Labs:  Admission on 02/03/2021  Component Date Value Ref Range Status   POC Amphetamine UR 02/03/2021 None Detected  NONE DETECTED (Cut Off Level 1000 ng/mL) Final   POC Secobarbital (BAR) 02/03/2021 None Detected  NONE DETECTED (Cut Off Level 300 ng/mL) Final   POC Buprenorphine (BUP) 02/03/2021 None Detected  NONE DETECTED (Cut Off Level 10 ng/mL) Final   POC Oxazepam (BZO) 02/03/2021 None Detected  NONE DETECTED (Cut Off  Level 300 ng/mL) Final   POC Cocaine UR 02/03/2021 None Detected  NONE DETECTED (Cut Off Level 300 ng/mL) Final   POC Methamphetamine UR 02/03/2021 None Detected  NONE DETECTED (Cut Off Level 1000 ng/mL) Final   POC Morphine 02/03/2021 None Detected  NONE DETECTED (Cut Off Level 300 ng/mL) Final   POC Oxycodone UR 02/03/2021 None Detected  NONE DETECTED (Cut Off Level 100 ng/mL) Final   POC Methadone UR 02/03/2021 None Detected  NONE DETECTED (Cut Off Level 300 ng/mL) Final   POC Marijuana UR 02/03/2021 None Detected  NONE DETECTED (Cut Off Level 50 ng/mL) Final   SARS Coronavirus 2 Ag 02/03/2021 Negative  Negative Final   SARSCOV2ONAVIRUS 2 AG 02/03/2021 NEGATIVE  NEGATIVE Final   Comment: (NOTE) SARS-CoV-2 antigen NOT DETECTED.   Negative results are presumptive.  Negative results do not preclude SARS-CoV-2 infection and should not be used as the sole basis for treatment or other patient management decisions, including infection  control decisions, particularly in the presence of clinical signs and  symptoms consistent with COVID-19, or in those who have been in contact with the virus.  Negative results must be combined with clinical observations, patient history, and epidemiological information. The expected result is Negative.  Fact Sheet for Patients: HandmadeRecipes.com.cy  Fact Sheet for Healthcare Providers: FuneralLife.at  This test is not yet approved or cleared by the [place] FDA and  has been authorized for detection and/or diagnosis of SARS-CoV-2 by FDA under an Emergency Use Authorization (EUA).  This EUA will remain in effect (meaning this test can be used) for the duration of  the COV  ID-19 declaration under Section 564(b)(1) of the Act, 21 U.S.C. section 360bbb-3(b)(1), unless the authorization is terminated or revoked sooner.    Office Visit on 11/25/2020  Component Date Value Ref Range  Status   TSH 11/25/2020 3.400  0.450 - 4.500 uIU/mL Final   Free T4 11/25/2020 1.24  0.82 - 1.77 ng/dL Final   Glucose 11/25/2020 93  65 - 99 mg/dL Final   BUN 11/25/2020 9  6 - 20 mg/dL Final   Creatinine, Ser 11/25/2020 0.68  0.57 - 1.00 mg/dL Final   eGFR 11/25/2020 121  >59 mL/min/1.73 Final   BUN/Creatinine Ratio 11/25/2020 13  9 - 23 Final   Sodium 11/25/2020 136  134 - 144 mmol/L Final   Potassium 11/25/2020 4.1  3.5 - 5.2 mmol/L Final   Chloride 11/25/2020 100  96 - 106 mmol/L Final   CO2 11/25/2020 23  20 - 29 mmol/L Final   Calcium 11/25/2020 9.4  8.7 - 10.2 mg/dL Final  Office Visit on 08/30/2020  Component Date Value Ref Range Status   Preg Test, Ur 08/30/2020 Negative  Negative Final   Glucose 08/30/2020 89  65 - 99 mg/dL Final   BUN 08/30/2020 12  6 - 20 mg/dL Final   Creatinine, Ser 08/30/2020 0.73  0.57 - 1.00 mg/dL Final   GFR calc non Af Amer 08/30/2020 112  >59 mL/min/1.73 Final   GFR calc Af Amer 08/30/2020 129  >59 mL/min/1.73 Final   Comment: **In accordance with recommendations from the NKF-ASN Task force,**   [facility] is in the process of updating its eGFR calculation to the   2021 CKD-EPI creatinine equation that estimates kidney function   without a race variable.    BUN/Creatinine Ratio 08/30/2020 16  9 - 23 Final   Sodium 08/30/2020 141  134 - 144 mmol/L Final   Potassium 08/30/2020 4.5  3.5 - 5.2 mmol/L Final   Chloride 08/30/2020 103  96 - 106 mmol/L Final   CO2 08/30/2020 22  20 - 29 mmol/L Final   Calcium 08/30/2020 9.3  8.7 - 10.2 mg/dL Final   Total Protein 08/30/2020 6.9  6.0 - 8.5 g/dL Final   Albumin 08/30/2020 4.4  3.9 - 5.0 g/dL Final   Globulin, Total 08/30/2020 2.5  1.5 - 4.5 g/dL Final   Albumin/Globulin Ratio 08/30/2020 1.8  1.2 - 2.2 Final   Bilirubin Total 08/30/2020 0.3  0.0 - 1.2 mg/dL Final   Alkaline Phosphatase 08/30/2020 73  44 - 121 IU/L Final   AST 08/30/2020 19  0 - 40 IU/L Final   ALT 08/30/2020 24  0 - 32 IU/L Final   hCG  Quant 08/30/2020 <1  mIU/mL Final   Comment:                      Female (Non-pregnant)    0 -     5                             (Postmenopausal)  0 -     8                      Female (Pregnant)                      Weeks of Gestation  3                6 -    71                              4               10 -   750                              5              217 -  7138                              6              [place]  CLIA methodology    Chlamydia trachomatis, NAA 08/30/2020 Negative  Negative Final   Neisseria Gonorrhoeae by PCR 08/30/2020 Negative  Negative Final  Admission on 08/23/2020, Discharged on 08/23/2020  Component Date Value Ref Range Status   Glucose, UA 08/23/2020 NEGATIVE  NEGATIVE mg/dL Final   Bilirubin Urine 08/23/2020 NEGATIVE  NEGATIVE Final   Ketones, ur 08/23/2020 TRACE (A) NEGATIVE mg/dL Final   Specific Gravity, Urine 08/23/2020 1.025  1.005 - 1.030 Final   Hgb urine dipstick 08/23/2020 NEGATIVE  NEGATIVE Final   pH 08/23/2020 7.0  5.0 - 8.0 Final   Protein, ur 08/23/2020 NEGATIVE  NEGATIVE mg/dL Final   Urobilinogen, UA 08/23/2020 0.2  0.0 - 1.0 mg/dL Final   Nitrite 08/23/2020 NEGATIVE  NEGATIVE Final   Leukocytes,Ua 08/23/2020 NEGATIVE  NEGATIVE Final   Biochemical Testing Only. Please order routine urinalysis from main lab if  confirmatory testing is needed.   hCG, Beta Chain, Quant, S 08/23/2020 1  <5 mIU/mL Final   Comment:          GEST. AGE      CONC.  (mIU/mL)   <=1 WEEK        5 - 50     2 WEEKS       50 - 500     3 WEEKS       100 - 10,000     4 WEEKS     1,000 - 30,000     5 WEEKS     3,500 - 115,000   6-8 WEEKS     12,000 - 270,000    12 WEEKS     15,000 - 220,000        FEMALE AND NON-PREGNANT FEMALE:     LESS THAN 5 mIU/mL Performed at [facility], [place] [address], [place], [place] [address]    SARS Coronavirus 2 08/23/2020 NEGATIVE  NEGATIVE Final   Comment: (NOTE) SARS-CoV-2 target nucleic acids are NOT DETECTED.  The SARS-CoV-2 RNA is generally detectable in upper and lower respiratory specimens during the acute phase of infection. Negative results do not preclude SARS-CoV-2 infection, do not rule out co-infections with other pathogens, and should not be used as the sole basis for treatment or other patient management decisions. Negative results must be combined with clinical observations, patient history, and epidemiological information. The expected result is Negative.  Fact Sheet for Patients: SugarRoll.be  Fact Sheet for Healthcare Providers: https://www.woods-mathews.com/  This test is not yet approved or cleared by the [place] FDA and  has been authorized for detection and/or diagnosis of SARS-CoV-2 by FDA under an Emergency Use Authorization (EUA). This EUA will remain  in effect (meaning this test can be used) for the duration of the COVID-19 declaration under Se                          ction 564(b)(1) of the Act, 21 U.S.C. section 360bbb-3(b)(1), unless the authorization is terminated or revoked sooner.  Performed at [facility], [place] [address], [place], [place] [address]    WBC 08/23/2020 8.4  4.0 - 10.5 K/uL Final   RBC 08/23/2020 4.90  3.87 - 5.11 MIL/uL Final   Hemoglobin 08/23/2020 14.6  12.0 - 15.0 g/dL  Final   HCT 08/23/2020 42.4  36.0 - 46.0 % Final   MCV 08/23/2020 86.5  80.0 - 100.0 fL Final   MCH 08/23/2020 29.8  26.0 - 34.0 pg Final   MCHC  08/23/2020 34.4  30.0 - 36.0 g/dL Final   RDW 08/23/2020 12.1  11.5 - 15.5 % Final   Platelets 08/23/2020 306  150 - 400 K/uL Final   nRBC 08/23/2020 0.0  0.0 - 0.2 % Final   Neutrophils Relative % 08/23/2020 54  % Final   Neutro Abs 08/23/2020 4.5  1.7 - 7.7 K/uL Final   Lymphocytes Relative 08/23/2020 36  % Final   Lymphs Abs 08/23/2020 3.0  0.7 - 4.0 K/uL Final   Monocytes Relative 08/23/2020 8  % Final   Monocytes Absolute 08/23/2020 0.7  0.1 - 1.0 K/uL Final   Eosinophils Relative 08/23/2020 1  % Final   Eosinophils Absolute 08/23/2020 0.1  0.0 - 0.5 K/uL Final   Basophils Relative 08/23/2020 1  % Final   Basophils Absolute 08/23/2020 0.0  0.0 - 0.1 K/uL Final   Immature Granulocytes 08/23/2020 0  % Final   Abs Immature Granulocytes 08/23/2020 0.03  0.00 - 0.07 K/uL Final   Performed at [facility], [place] [address] [place], [place] [address]   TSH 08/23/2020 12.516 (A) 0.350 - 4.500 uIU/mL Final   Comment: Performed by a 3rd Generation assay with a functional sensitivity of <=0.01 uIU/mL. Performed at [facility], [place] [address], [place], [place] [address]     Allergies: Patient has no known allergies.  PTA Medications: (Not in a hospital admission)   Medical Decision Making  Patient will be placed in the continuous assessment area at [facility] for treatment and stabilization.  Will reorder home medications including: -Escitalopram 20 mg daily - Synthroid 50 mcg daily before breakfast -Oral contraceptive daily  Discussed risk versus benefits as well as side effects of trazodone to assist with sleep, patient verbalizes understanding. -Trazodone 50 mg nightly as needed/sleep    Recommendations  Based on my evaluation the patient does not appear to have an emergency medical condition. Patient reviewed with Dr.  Serafina Mitchell.  Lucky Rathke, FNP 02/03/21  2:54 PM

## 2021-02-04 ENCOUNTER — Other Ambulatory Visit (HOSPITAL_COMMUNITY): Payer: Self-pay

## 2021-02-04 DIAGNOSIS — F332 Major depressive disorder, recurrent severe without psychotic features: Secondary | ICD-10-CM

## 2021-02-04 LAB — POCT PREGNANCY, URINE: Preg Test, Ur: NEGATIVE

## 2021-02-04 LAB — GLUCOSE, CAPILLARY: Glucose-Capillary: 78 mg/dL (ref 70–99)

## 2021-02-04 LAB — PROLACTIN: Prolactin: 14.4 ng/mL (ref 4.8–23.3)

## 2021-02-04 MED ORDER — TRAZODONE HCL 50 MG PO TABS
50.0000 mg | ORAL_TABLET | Freq: Every evening | ORAL | 0 refills | Status: DC | PRN
  Filled 2021-02-04: qty 30, 30d supply, fill #0

## 2021-02-04 NOTE — ED Notes (Signed)
Patient is alert and oriented X 4, denies SI, HI and AVH. Patient rates pain 0/10 at discharge. Patient received After Visit Summary with follow up instructions. Patient received all belongings from [facility] locker. Patient discharge home with significant other.

## 2021-02-04 NOTE — ED Notes (Signed)
Pt given breakfast.

## 2021-02-04 NOTE — ED Provider Notes (Signed)
FBC/OBS ASAP Discharge Summary  Date and Time: 02/04/2021 9:42 AM  Name: Olivia Sellers  MRN:  [id number]   Discharge Diagnoses:  Final diagnoses:  Severe episode of recurrent major depressive disorder, without psychotic features (HCC)    Subjective: Patient states "I think I am ready to go home."  She is reassessed by nurse practitioner.  She is seated in assessment area, no distress noted.  Patient's boyfriend removed "noose" from her car per patient's request.  She denies suicidal and homicidal ideations.  She contracts verbally for safety with this Clinical research associate.  She denies auditory and visual hallucinations.  There is no evidence of delusional thought content she denies symptoms of paranoia.  She has been diagnosed with major depressive disorder, recently resumed outpatient counseling.  Olivia Sellers has been referred by primary care provider to outpatient psychiatry, will provide additional resources for [facility] outpatient behavioral health.  She endorses average sleep and appetite.  She verbalizes readiness to discharge home.  Patient offered support and encouragement.  She gives verbal consent to speak with her boyfriend, home, Eusebio Friendly phone number [phone].  Spoke with patient's boyfriend who denies concern for patient safety, confirms there are no weapons in home.  He reports plan to pick up patient this morning.  Stay Summary:  HPI 02/03/2021   Patient transported from [facility] behavioral health to [facility] behavioral health for observation admission, patient voluntary at this time.   Olivia Sellers is assessed by nurse practitioner.  She is seated in assessment area, no apparent distress.  She is alert and oriented, pleasant and cooperative during assessment.   She was directed to [facility] behavioral health by outpatient therapy, attended initial outpatient therapist visit earlier today.  She reports feeling suicidal earlier today, denies suicidal ideations at this time.  She states "I am  just focused on getting some help and feeling better."   Shawntae attempted suicide by hanging approximately 1 month ago, noose remains in the trunk of her car.  She reports the trigger for the suicide attempt was "a really bad night at work with a combative patient."  She endorses 1 prior episode of suicidal ideation at age 76.  At that time she did not act on suicidal ideation but states she "slept with a gun in my bed and thought about it."   She endorses nonsuicidal, self-harm behavior by cutting.  She reports last episode of cutting in February 2022.   She denies homicidal ideations.  She denies both auditory and visual hallucinations.  There is no evidence of delusional thought content and no indication that she is responding to internal stimuli.   She has been diagnosed with anxiety and depression.  She is prescribed Lexapro by her primary care provider.  She reports compliance with Lexapro.  Initial outpatient counseling appointment completed today.  She endorses family history of mental illness including her father who has been diagnosed with bipolar schizoaffective disorder.   Olivia Sellers resides in [place] with her boyfriend of 3 years.  She denies access to weapons.  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  She reports working night shift for many years, this causes her sleep to be variable.  She states she sleeps from 1 hour to 12 hours per night.  She has accepted a new position that begins on 7/16, hours are day shift.  She reports average appetite.   She endorses alcohol use.  States she "only drinks to get drunk."  She reports drinking 2-3 beers once per week or  less.  She denies substance use aside from alcohol.    Olivia Sellers is a 30 y.o female, seen by this provider with TTS counselor face to face, [facility] accompanied by no one.  Reports "I went to my therapist today and I revealed that I had a second suicide attempt on Father's Day "reports  that she tried to hang herself on January 16, 2021 with a noose in the woods.  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    She is alert and oriented to person place time situation and year, has good eye contact throughout interview, sitting calmly in exam room.  She is cooperative, describes her mood as irritable, tearful at times, anger, anxious and depressed.  Mood is congruent with affect.  Speech normal, volume normal.  Thought processes are intact.  Denies auditory and visual hallucinations, endorses suicidal ideation, reports that she does not really want to die,however, she reports that she still has the noose in her car.  Endorses self injurious behaviors, she cuts herself, scars are visible on her arms.  Denies homicidal ideation.  Reports that she does not use substances however she drinks with the goal of getting drunk, last beer was 1 week ago, last episode of getting drunk was 3 weeks ago.  She reports emotional abuse, reports having a "messed up family "sleep is disturbed and variable due to night shift worker, appetite is decreased.  She is employed as a Engineer, civil (consulting)nurse.  Permission given to contact Molli HazardMatthew, her boyfriend with whom she lives with, message left for Molli HazardMatthew to return call, left message for sister, Suzzette RighterRachel, Rachel return call and reports that she does not see her sister but every 1 to 2 months, and they recently had a fight.   Total Time spent with patient: 20 minutes  Past Psychiatric History:  Past Medical History:  Past Medical History:  Diagnosis Date   Anemia    Anxiety    Cholelithiasis    Depression    Encounter for gynecological examination with Papanicolaou smear of cervix 08/20/2019   Encounter for initial prescription of contraceptive pills 02/19/2020   Encounter for Nexplanon removal  02/19/2020   General counseling and advice for contraceptive management 08/20/2019   Nexplanon insertion 09/09/2019   Pilonidal cyst    hx of    Past Surgical History:  Procedure Laterality Date   CHOLECYSTECTOMY N/A 08/22/2019   Procedure: LAPAROSCOPIC CHOLECYSTECTOMY;  Surgeon: Axel Filleramirez, Armando, MD;  [facility] ;  Service: General;  Laterality: N/A;   SURGERY OF LIP     suture to lip at age 27   wisdom teeth removed     Family History:  Family History  Problem Relation Age of Onset   Panic disorder Mother    Drug abuse Mother    Alcohol abuse Mother    Depression Mother    COPD Mother    Bipolar disorder Father    Alcohol abuse Father    Family Psychiatric History: Father-bipolar disorder Social History:  Social History   Substance and Sexual Activity  Alcohol Use Yes   Comment: socially      Social History   Substance and Sexual Activity  Drug Use Never    Social History   Socioeconomic History   Marital status: Single  Spouse name: Not on file   Number of children: Not on file   Years of education: Not on file   Highest education level: Associate degree: academic program  Occupational History   Occupation: RN  Tobacco Use   Smoking status: Never   Smokeless tobacco: Never  Vaping Use   Vaping Use: Never used  Substance and Sexual Activity   Alcohol use: Yes    Comment: socially    Drug use: Never   Sexual activity: Yes    Birth control/protection: Pill  Other Topics Concern   Not on file  Social History Narrative   Lives alone currently   No pets      Enjoys drawing      Diet: was eating fast food a lot, eating more veggies and fruits now, beans-for vegetarian like    Caffeine: 3 cups a coffee, no energy drinks, some sweet tea   Water: 2 cups -does drink milk daily      Wears seat belt   Wears sun protection   Smoke detectors    Does not use phone while driving       Social Determinants of Research scientist (physical sciences) Strain: Low Risk    Difficulty of Paying Living Expenses: Not hard at all  Food Insecurity: No Food Insecurity   Worried About Programme researcher, broadcasting/film/video in the Last Year: Never true   Barista in the Last Year: Never true  Transportation Needs: No Transportation Needs   Lack of Transportation (Medical): No   Lack of Transportation (Non-Medical): No  Physical Activity: Insufficiently Active   Days of Exercise per Week: 2 days   Minutes of Exercise per Session: 60 min  Stress: No Stress Concern Present   Feeling of Stress : Not at all  Social Connections: Socially Isolated   Frequency of Communication with Friends and Family: Once a week   Frequency of Social Gatherings with Friends and Family: Once a week   Attends Religious Services: Never   Database administrator or Organizations: No   Attends Engineer, structural: Never   Marital Status: Living with partner   SDOH:  SDOH Screenings   Alcohol Screen: Low Risk    Last Alcohol Screening Score (AUDIT): 1  Depression (PHQ2-9): Medium Risk   PHQ-2 Score: 27  Financial Resource Strain: Low Risk    Difficulty of Paying Living Expenses: Not hard at all  Food Insecurity: No Food Insecurity   Worried About Programme researcher, broadcasting/film/video in the Last Year: Never true   Ran Out of Food in the Last Year: Never true  Housing: Low Risk    Last Housing Risk Score: 0  Physical Activity: Insufficiently Active   Days of Exercise per Week: 2 days   Minutes of Exercise per Session: 60 min  Social Connections: Socially Isolated   Frequency of Communication with Friends and Family: Once a week   Frequency of Social Gatherings with Friends and Family: Once a week   Attends Religious Services: Never   Database administrator or Organizations: No   Attends Engineer, structural: Never   Marital Status: Living with partner  Stress: No Stress Concern Present   Feeling of Stress : Not at all  Tobacco Use: Low Risk    Smoking Tobacco  Use: Never   Smokeless Tobacco Use: Never  Transportation Needs: No Transportation Needs   Lack of Transportation (Medical): No   Lack of Transportation (Non-Medical): No  Tobacco Cessation:  N/A, patient does not currently use tobacco products  Current Medications:  Current Facility-Administered Medications  Medication Dose Route Frequency Provider Last Rate Last Admin   acetaminophen (TYLENOL) tablet 650 mg  650 mg Oral Q6H PRN Lenard Lance, FNP       alum & mag hydroxide-simeth (MAALOX/MYLANTA) 200-200-20 MG/5ML suspension 30 mL  30 mL Oral Q4H PRN Lenard Lance, FNP       escitalopram (LEXAPRO) tablet 20 mg  20 mg Oral Daily Lenard Lance, FNP   20 mg at 02/04/21 0820   levothyroxine (SYNTHROID) tablet 50 mcg  50 mcg Oral QAC breakfast Lenard Lance, FNP   50 mcg at 02/04/21 3825   magnesium hydroxide (MILK OF MAGNESIA) suspension 30 mL  30 mL Oral Daily PRN Lenard Lance, FNP       norethindrone-ethinyl estradiol-FE (LOESTRIN FE) 1-20 MG-MCG per tablet 1 tablet  1 tablet Oral Daily Lenard Lance, FNP       Current Outpatient Medications  Medication Sig Dispense Refill   escitalopram (LEXAPRO) 20 MG tablet Take 1 tablet (20 mg total) by mouth daily. 90 tablet 1   levothyroxine (SYNTHROID) 50 MCG tablet Take 1 tablet (50 mcg total) by mouth daily before breakfast. 30 tablet 2   norethindrone-ethinyl estradiol (LOESTRIN FE) 1-20 MG-MCG tablet TAKE 1 TABLET BY MOUTH ONCE DAILY. 140 tablet 0    PTA Medications: (Not in a hospital admission)   Musculoskeletal  Strength & Muscle Tone: within normal limits Gait & Station: normal Patient leans: N/A  Psychiatric Specialty Exam  Presentation  General Appearance: Appropriate for Environment; Casual  Eye Contact:Good  Speech:Clear and Coherent; Normal Rate  Speech Volume:Normal  Handedness:Right   Mood and Affect  Mood:Euthymic  Affect:Appropriate; Congruent   Thought Process  Thought Processes:Coherent; Goal  Directed  Descriptions of Associations:Intact  Orientation:Full (Time, Place and Person)  Thought Content:Logical; WDL  Diagnosis of Schizophrenia or Schizoaffective disorder in past: No    Hallucinations:Hallucinations: None  Ideas of Reference:None  Suicidal Thoughts:Suicidal Thoughts: No SI Passive Intent and/or Plan: Without Intent; Without Plan; With Means to Carry Out  Homicidal Thoughts:Homicidal Thoughts: No   Sensorium  Memory:Immediate Good; Recent Good; Remote Good  Judgment:Good  Insight:Good   Executive Functions  Concentration:Good  Attention Span:Good  Recall:Good  Fund of Knowledge:Good  Language:Good   Psychomotor Activity  Psychomotor Activity:Psychomotor Activity: Normal   Assets  Assets:Communication Skills; Desire for Improvement; Financial Resources/Insurance; Housing; Intimacy; Leisure Time; Physical Health; Resilience; Social Support; Talents/Skills; Transportation   Sleep  Sleep:Sleep: Fair   Nutritional Assessment (For OBS and FBC admissions only) Has the patient had a weight loss or gain of 10 pounds or more in the last 3 months?: No Has the patient had a decrease in food intake/or appetite?: No Does the patient have dental problems?: No Does the patient have eating habits or behaviors that may be indicators of an eating disorder including binging or inducing vomiting?: No Has the patient recently lost weight without trying?: No Has the patient been eating poorly because of a decreased appetite?: No Malnutrition Screening Tool Score: 0    Physical Exam  Physical Exam Vitals and nursing note reviewed.  Constitutional:      Appearance: Normal appearance. She is well-developed and normal weight.  HENT:     Head: Normocephalic and atraumatic.     Nose: Nose normal.  Cardiovascular:     Rate and Rhythm: Normal rate.  Pulmonary:     Effort:  Pulmonary effort is normal.  Musculoskeletal:        General: Normal range of  motion.     Cervical back: Normal range of motion.  Neurological:     Mental Status: She is alert and oriented to person, place, and time.  Psychiatric:        Attention and Perception: Attention and perception normal.        Mood and Affect: Mood and affect normal.        Speech: Speech normal.        Behavior: Behavior normal. Behavior is cooperative.        Thought Content: Thought content normal.        Cognition and Memory: Cognition and memory normal.        Judgment: Judgment normal.   Review of Systems  Constitutional: Negative.   HENT: Negative.    Eyes: Negative.   Respiratory: Negative.    Cardiovascular: Negative.   Gastrointestinal: Negative.   Genitourinary: Negative.   Musculoskeletal: Negative.   Skin: Negative.   Neurological: Negative.   Endo/Heme/Allergies: Negative.   Psychiatric/Behavioral: Negative.    Blood pressure 111/74, pulse 77, temperature 98.1 F (36.7 C), temperature source Oral, resp. rate 16, SpO2 99 %. There is no height or weight on file to calculate BMI.  Demographic Factors:  Caucasian  Loss Factors: NA  Historical Factors: Prior suicide attempts  Risk Reduction Factors:   Sense of responsibility to family, Employed, Living with another person, especially a relative, Positive social support, Positive therapeutic relationship, and Positive coping skills or problem solving skills  Continued Clinical Symptoms:  Previous Psychiatric Diagnoses and Treatments  Cognitive Features That Contribute To Risk:  None    Suicide Risk:  Minimal: No identifiable suicidal ideation.  Patients presenting with no risk factors but with morbid ruminations; may be classified as minimal risk based on the severity of the depressive symptoms  Plan Of Care/Follow-up recommendations:  Other:  Patient reviewed with Dr Bronwen Betters. Follow-up with outpatient counseling. Follow-up with outpatient psychiatry, resources provided. Medications: -Escitalopram 20 mg  daily -Synthroid 50 mcg before breakfast daily -Oral contraceptive -Trazodone 50 mg nightly as needed/sleep Follow-up with primary care provider.  Disposition: Discharge  Lenard Lance, FNP 02/04/2021, 9:42 AM

## 2021-02-04 NOTE — Discharge Instructions (Addendum)

## 2021-02-04 NOTE — Progress Notes (Signed)
Patient is alert and oriented X 4, denies SI, HI and AVH. Patient able to take scheduled medications appropriately. Patient denies pain 0/10. Patient does not seem to be responding to internal stimuli, very pleasant and appropriate to staff.  Nursing staff will continue to monitor.

## 2021-02-07 ENCOUNTER — Telehealth (HOSPITAL_COMMUNITY): Payer: Self-pay | Admitting: Licensed Clinical Social Worker

## 2021-02-08 ENCOUNTER — Other Ambulatory Visit: Payer: Self-pay | Admitting: Family Medicine

## 2021-02-08 DIAGNOSIS — F418 Other specified anxiety disorders: Secondary | ICD-10-CM

## 2021-02-09 ENCOUNTER — Telehealth: Payer: Self-pay

## 2021-02-09 ENCOUNTER — Other Ambulatory Visit (HOSPITAL_COMMUNITY): Payer: Self-pay

## 2021-02-09 MED ORDER — ESCITALOPRAM OXALATE 20 MG PO TABS
20.0000 mg | ORAL_TABLET | Freq: Every day | ORAL | 1 refills | Status: DC
  Filled 2021-02-09: qty 90, 90d supply, fill #0
  Filled 2021-05-09: qty 90, 90d supply, fill #1

## 2021-02-09 NOTE — Telephone Encounter (Signed)
Patient called need med refill Rx #: [id number]  escitalopram (LEXAPRO) 20 MG tablet   Pharmacy  [facility]  [address], [place] [address]  Phone:  [phone]  Fax:  [phone]  DEA #:  [id number]  Pharmacy Comments: TRANSFERRED FROM RX [phone] AT STORE [id number] TO RX [id number] AT STORE [phone].

## 2021-02-09 NOTE — Telephone Encounter (Signed)
RX REFILLED

## 2021-03-07 ENCOUNTER — Ambulatory Visit: Payer: No Typology Code available for payment source | Admitting: Nurse Practitioner

## 2021-03-15 ENCOUNTER — Other Ambulatory Visit (HOSPITAL_COMMUNITY): Payer: Self-pay

## 2021-03-15 ENCOUNTER — Other Ambulatory Visit: Payer: Self-pay | Admitting: Nurse Practitioner

## 2021-03-15 DIAGNOSIS — E039 Hypothyroidism, unspecified: Secondary | ICD-10-CM

## 2021-03-15 MED ORDER — LEVOTHYROXINE SODIUM 50 MCG PO TABS
50.0000 ug | ORAL_TABLET | Freq: Every day | ORAL | 2 refills | Status: DC
  Filled 2021-03-15: qty 30, 30d supply, fill #0
  Filled 2021-04-15: qty 30, 30d supply, fill #1
  Filled 2021-05-09: qty 30, 30d supply, fill #2

## 2021-03-22 ENCOUNTER — Other Ambulatory Visit: Payer: Self-pay

## 2021-03-22 ENCOUNTER — Encounter: Payer: Self-pay | Admitting: Nurse Practitioner

## 2021-03-22 ENCOUNTER — Ambulatory Visit (INDEPENDENT_AMBULATORY_CARE_PROVIDER_SITE_OTHER): Payer: No Typology Code available for payment source | Admitting: Nurse Practitioner

## 2021-03-22 VITALS — BP 108/71 | HR 99 | Temp 98.6°F | Ht 67.5 in | Wt 158.0 lb

## 2021-03-22 DIAGNOSIS — F418 Other specified anxiety disorders: Secondary | ICD-10-CM | POA: Diagnosis not present

## 2021-03-22 DIAGNOSIS — T71162A Asphyxiation due to hanging, intentional self-harm, initial encounter: Secondary | ICD-10-CM | POA: Diagnosis not present

## 2021-03-22 DIAGNOSIS — E039 Hypothyroidism, unspecified: Secondary | ICD-10-CM

## 2021-03-22 NOTE — Assessment & Plan Note (Signed)
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

## 2021-03-22 NOTE — Patient Instructions (Signed)
Please have labs drawn 2-3 days prior to your appointment so we can discuss the results during your office visit.  

## 2021-03-22 NOTE — Assessment & Plan Note (Signed)
-  check thyroid function labs

## 2021-03-22 NOTE — Assessment & Plan Note (Signed)
-  MDQ competed today and she scored a 2, so negative for bipolar screening

## 2021-03-22 NOTE — Progress Notes (Signed)
Acute Office Visit  Subjective:    Patient ID: Olivia Sellers, female    DOB: 17-Nov-1990, 30 y.o.   MRN: [id number]  Chief Complaint  Patient presents with   Follow-up    Labs-tsh elevated    HPI Patient is in today for thyroid check. Her TSH was elevated on 02/03/21.  She had suicide attempt, but had psych visit on 02/03/21. She tried to get therapy, but isn't seeing one currently. She knows that she can call to set up an appointment if she needs it.  Past Medical History:  Diagnosis Date   Anemia    Anxiety    Cholelithiasis    Depression    Encounter for gynecological examination with Papanicolaou smear of cervix 08/20/2019   Encounter for initial prescription of contraceptive pills 02/19/2020   Encounter for Nexplanon removal 02/19/2020   General counseling and advice for contraceptive management 08/20/2019   Nexplanon insertion 09/09/2019   Pilonidal cyst    hx of    Past Surgical History:  Procedure Laterality Date   CHOLECYSTECTOMY N/A 08/22/2019   Procedure: LAPAROSCOPIC CHOLECYSTECTOMY;  Surgeon: Ralene Ok, MD;  Location: [facility];  Service: General;  Laterality: N/A;   SURGERY OF LIP     suture to lip at age 35   wisdom teeth removed      Family History  Problem Relation Age of Onset   Panic disorder Mother    Drug abuse Mother    Alcohol abuse Mother    Depression Mother    COPD Mother    Bipolar disorder Father    Alcohol abuse Father     Social History   Socioeconomic History   Marital status: Single    Spouse name: Not on file   Number of children: Not on file   Years of education: Not on file   Highest education level: Associate degree: academic program  Occupational History   Occupation: RN  Tobacco Use   Smoking status: Never   Smokeless tobacco: Never  Vaping Use   Vaping Use: Never used  Substance and Sexual Activity   Alcohol use: Yes    Comment: socially    Drug use: Never   Sexual activity: Yes    Birth  control/protection: Pill  Other Topics Concern   Not on file  Social History Narrative   Lives alone currently   No pets      Enjoys drawing      Diet: was eating fast food a lot, eating more veggies and fruits now, beans-for vegetarian like    Caffeine: 3 cups a coffee, no energy drinks, some sweet tea   Water: 2 cups -does drink milk daily      Wears seat belt   Wears sun protection   Smoke detectors    Does not use phone while driving       Social Determinants of Radio broadcast assistant Strain: Low Risk    Difficulty of Paying Living Expenses: Not hard at all  Food Insecurity: No Food Insecurity   Worried About Charity fundraiser in the Last Year: Never true   [place] in the Last Year: Never true  Transportation Needs: No Transportation Needs   Lack of Transportation (Medical): No   Lack of Transportation (Non-Medical): No  Physical Activity: Insufficiently Active   Days of Exercise per Week: 2 days   Minutes of Exercise per Session: 60 min  Stress: No Stress Concern Present   Feeling  of Stress : Not at all  Social Connections: Socially Isolated   Frequency of Communication with Friends and Family: Once a week   Frequency of Social Gatherings with Friends and Family: Once a week   Attends Religious Services: Never   Marine scientist or Organizations: No   Attends Music therapist: Never   Marital Status: Living with partner  Intimate Partner Violence: Not At Risk   Fear of Current or Ex-Partner: No   Emotionally Abused: No   Physically Abused: No   Sexually Abused: No    Outpatient Medications Prior to Visit  Medication Sig Dispense Refill   escitalopram (LEXAPRO) 20 MG tablet Take 1 tablet (20 mg total) by mouth daily. 90 tablet 1   levothyroxine (SYNTHROID) 50 MCG tablet Take 1 tablet (50 mcg total) by mouth daily before breakfast. 30 tablet 2   norethindrone-ethinyl estradiol (LOESTRIN FE) 1-20 MG-MCG tablet TAKE 1 TABLET BY  MOUTH ONCE DAILY. 140 tablet 0   traZODone (DESYREL) 50 MG tablet Take 1 tablet (50 mg total) by mouth at bedtime as needed for sleep. 30 tablet 0   No facility-administered medications prior to visit.    No Known Allergies  Review of Systems  Constitutional: Negative.   Respiratory: Negative.    Cardiovascular: Negative.   Psychiatric/Behavioral:         Suicide attempt in July; has talked with psychiatry; had counselor, but stopped that -Sleep has improved with trazodone      Objective:    Physical Exam Constitutional:      Appearance: Normal appearance.  Cardiovascular:     Rate and Rhythm: Normal rate and regular rhythm.     Pulses: Normal pulses.     Heart sounds: Normal heart sounds.  Pulmonary:     Effort: Pulmonary effort is normal.     Breath sounds: Normal breath sounds.  Neurological:     Mental Status: She is alert.    BP 108/71 (BP Location: Left Arm, Patient Position: Sitting, Cuff Size: Normal)   Pulse 99   Temp 98.6 F (37 C) (Oral)   Ht 5' 7.5" (1.715 m)   Wt 158 lb (71.7 kg)   LMP 03/02/2021 (Approximate)   SpO2 97%   BMI 24.38 kg/m  Wt Readings from Last 3 Encounters:  03/22/21 158 lb (71.7 kg)  02/03/21 157 lb (71.2 kg)  11/25/20 153 lb 1.9 oz (69.5 kg)    Health Maintenance Due  Topic Date Due   INFLUENZA VACCINE  02/28/2021    There are no preventive care reminders to display for this patient.   Lab Results  Component Value Date   TSH 5.172 (H) 02/03/2021   Lab Results  Component Value Date   WBC 8.4 02/03/2021   HGB 14.2 02/03/2021   HCT 42.5 02/03/2021   MCV 90.0 02/03/2021   PLT 315 02/03/2021   Lab Results  Component Value Date   NA 136 02/03/2021   K 4.5 02/03/2021   CO2 25 02/03/2021   GLUCOSE 87 02/03/2021   BUN 11 02/03/2021   CREATININE 0.65 02/03/2021   BILITOT 0.8 02/03/2021   ALKPHOS 58 02/03/2021   AST 19 02/03/2021   ALT 19 02/03/2021   PROT 7.2 02/03/2021   ALBUMIN 4.2 02/03/2021   CALCIUM 9.5  02/03/2021   ANIONGAP 11 02/03/2021   EGFR 121 11/25/2020   Lab Results  Component Value Date   CHOL 178 02/03/2021   Lab Results  Component Value Date   HDL  80 02/03/2021   Lab Results  Component Value Date   LDLCALC 84 02/03/2021   Lab Results  Component Value Date   TRIG 68 02/03/2021   Lab Results  Component Value Date   CHOLHDL 2.2 02/03/2021   Lab Results  Component Value Date   HGBA1C 5.0 02/03/2021       Assessment & Plan:   Problem List Items Addressed This Visit       Endocrine   Acquired hypothyroidism - Primary    -check thyroid function labs      Relevant Orders   TSH+T4F+T3Free   TSH + free T4   Basic metabolic panel     Other   Suicide attempt by hanging ([place])    -no suicidal ideation today; she states she has some situational stressors, but she is working through it, sleeping better, and swapped jobs from ICU to the [place] and things are a little better        No orders of the defined types were placed in this encounter.    Noreene Larsson, NP

## 2021-03-23 LAB — BASIC METABOLIC PANEL
BUN/Creatinine Ratio: 21 (ref 9–23)
BUN: 13 mg/dL (ref 6–20)
CO2: 24 mmol/L (ref 20–29)
Calcium: 9.2 mg/dL (ref 8.7–10.2)
Chloride: 103 mmol/L (ref 96–106)
Creatinine, Ser: 0.62 mg/dL (ref 0.57–1.00)
Glucose: 111 mg/dL — ABNORMAL HIGH (ref 65–99)
Potassium: 4.4 mmol/L (ref 3.5–5.2)
Sodium: 139 mmol/L (ref 134–144)
eGFR: 124 mL/min/{1.73_m2} (ref >59)

## 2021-03-23 LAB — TSH+FREE T4
Free T4: 1.13 ng/dL (ref 0.82–1.77)
TSH: 2.19 u[IU]/mL (ref 0.450–4.500)

## 2021-03-23 NOTE — Progress Notes (Signed)
Thyroid labs are great today. Glucose was elevated, but you weren't fasting, so no big deal.

## 2021-04-06 IMAGING — US US ABDOMEN LIMITED
1 series · 14 of 25 positions shown · non-contrast
Comparison: None.

CLINICAL DATA: Right upper quadrant pain after eating

EXAM:
ULTRASOUND ABDOMEN LIMITED RIGHT UPPER QUADRANT

[Series 1: us abdomen limited · 14 of 79 slices shown]
[im 1/79]
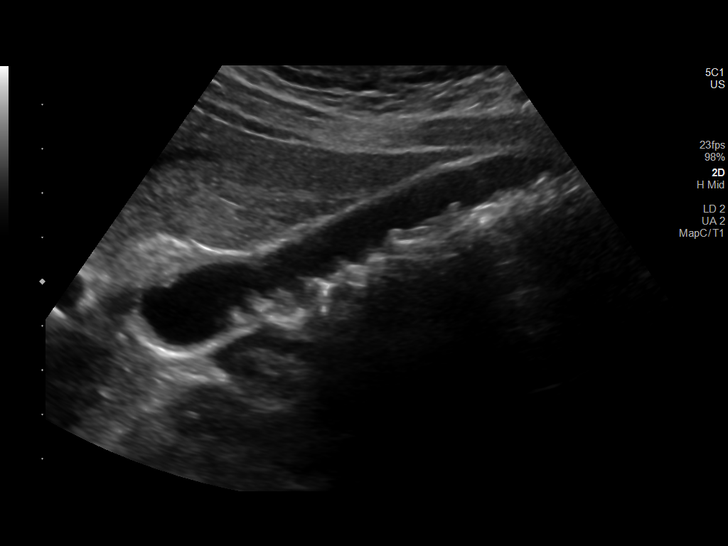
[im 7/79]
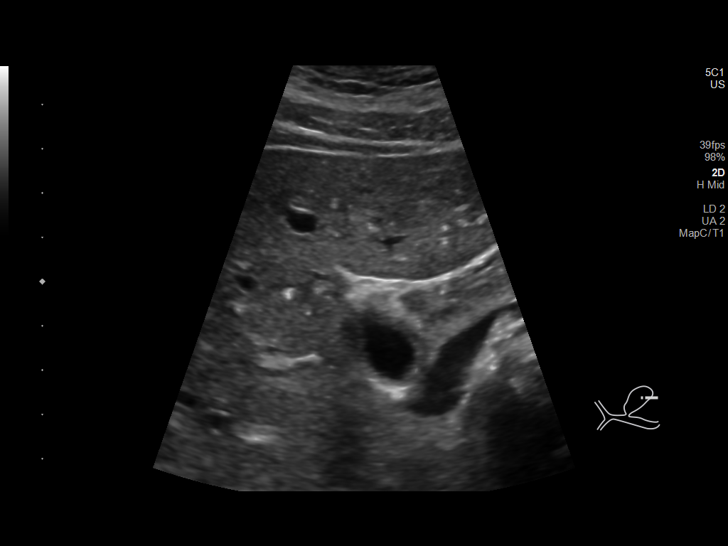
[im 14/79]
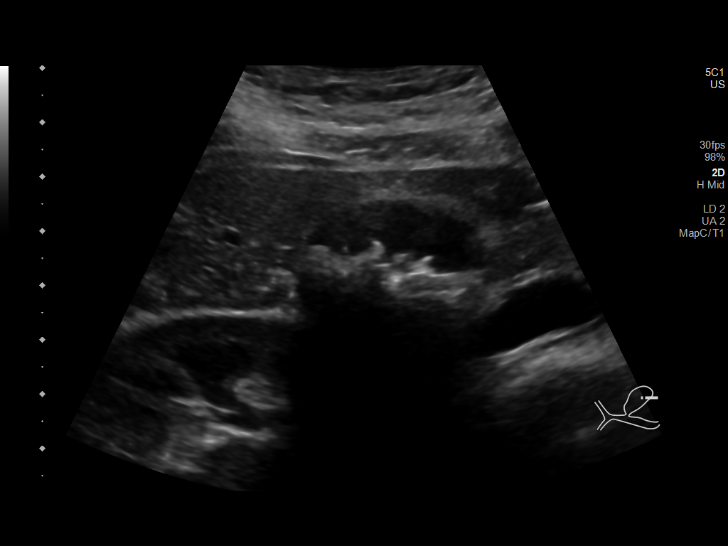
[im 20/79]
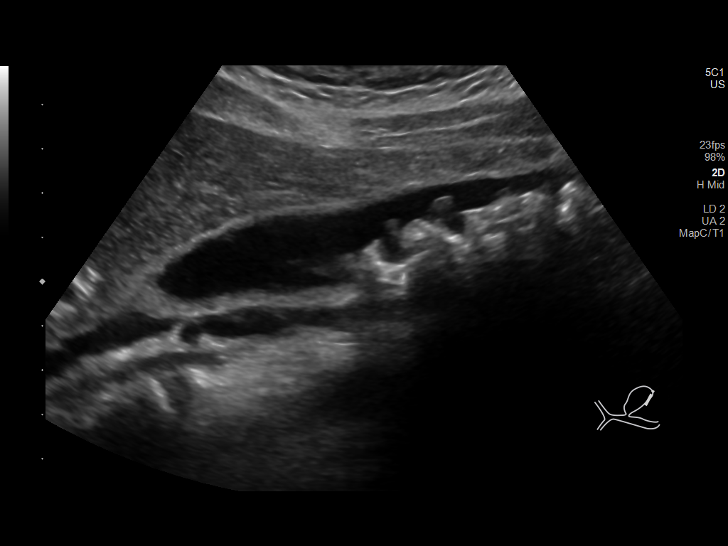
[im 27/79]
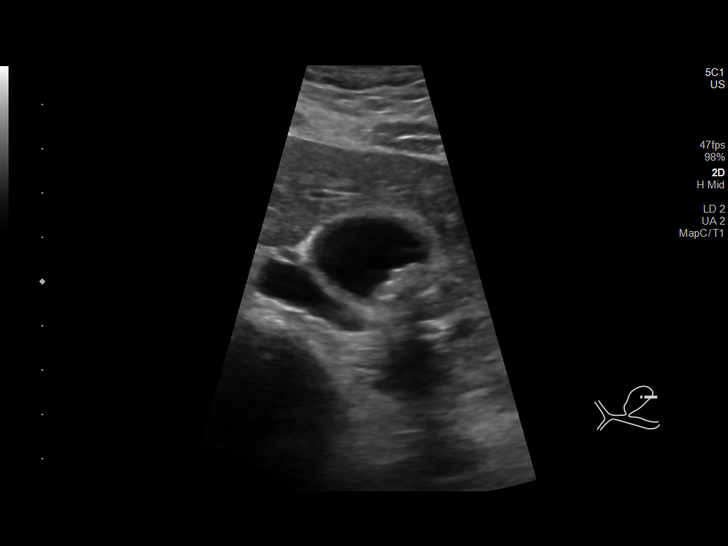
[im 30/79]
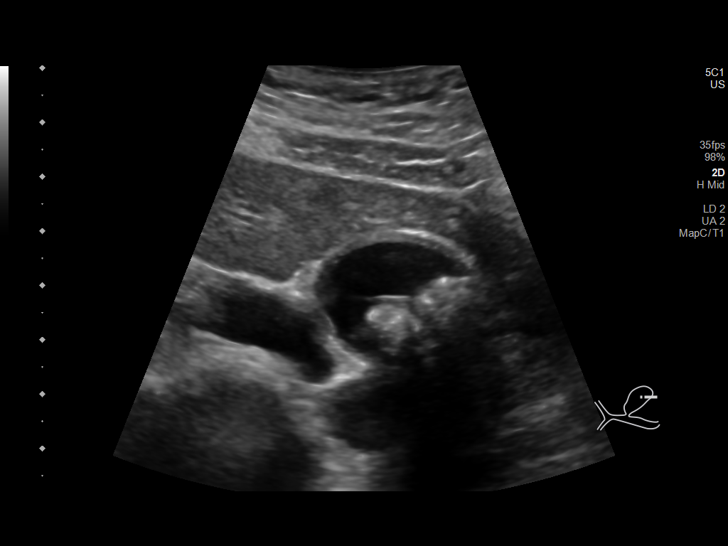
[im 36/79]
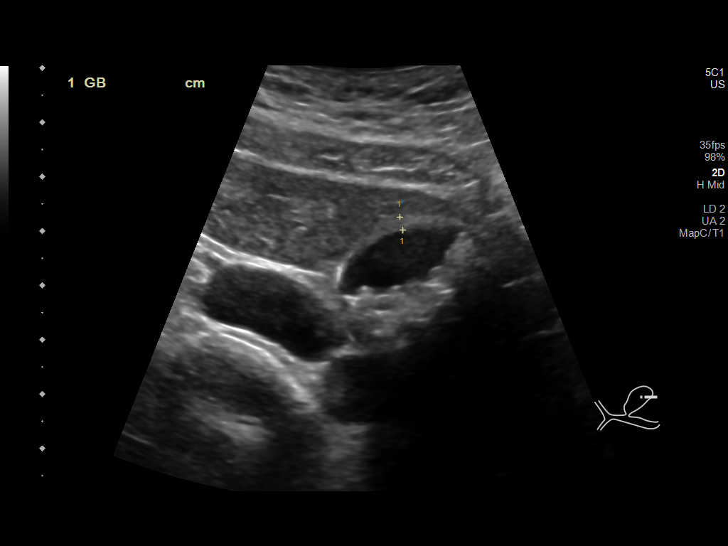
[im 43/79]
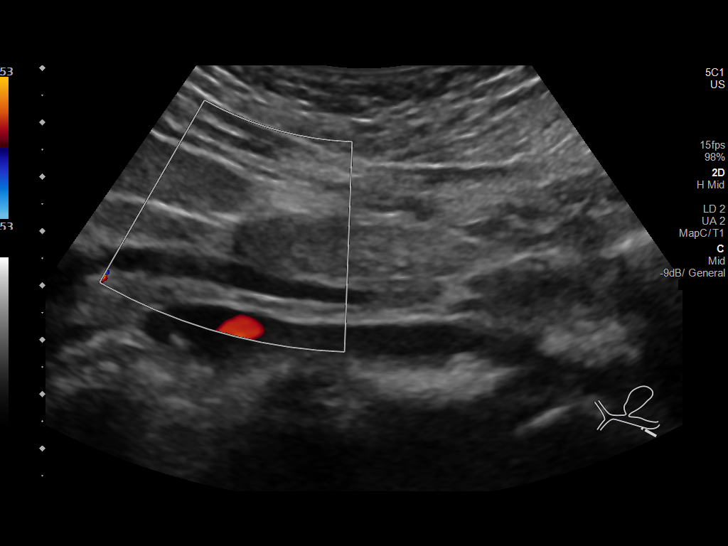
[im 49/79]
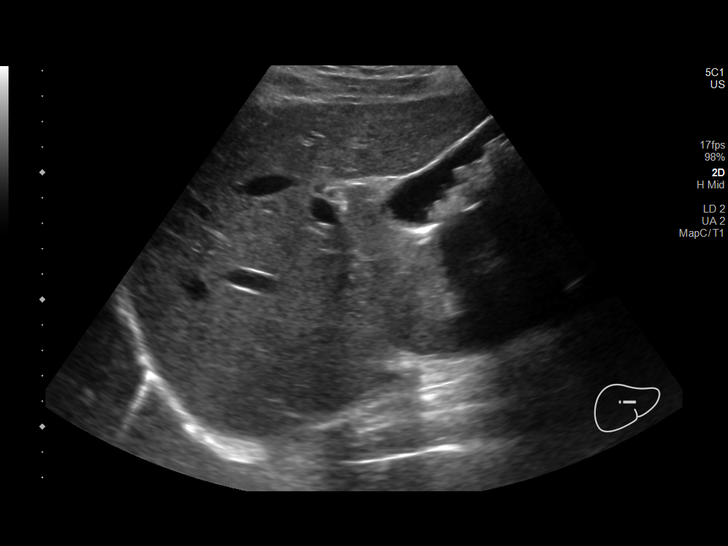
[im 53/79]
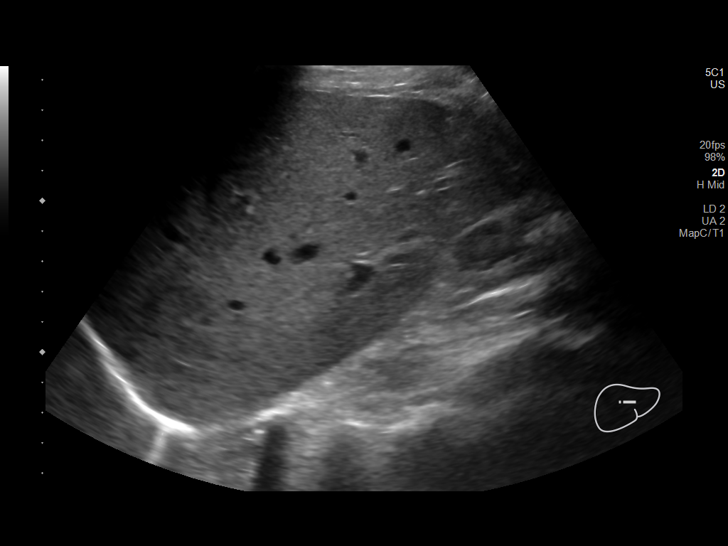
[im 59/79]
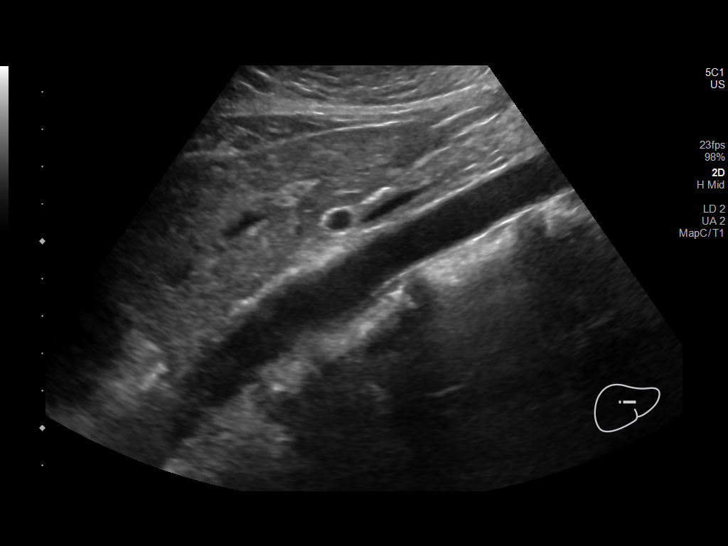
[im 66/79]
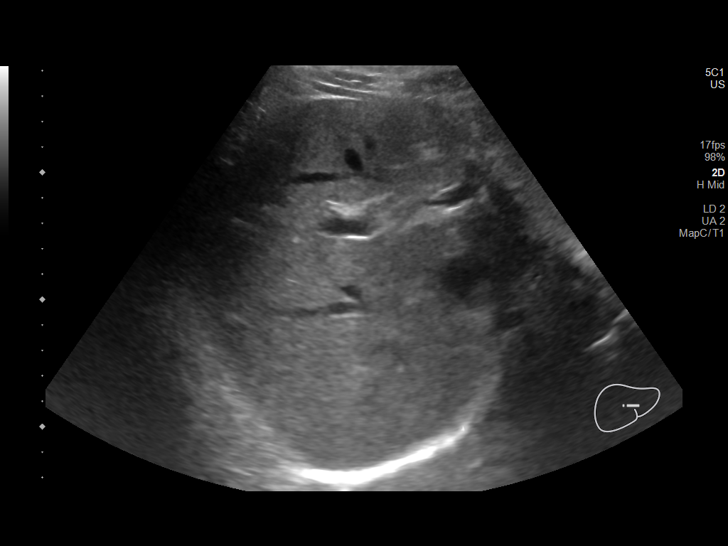
[im 72/79]
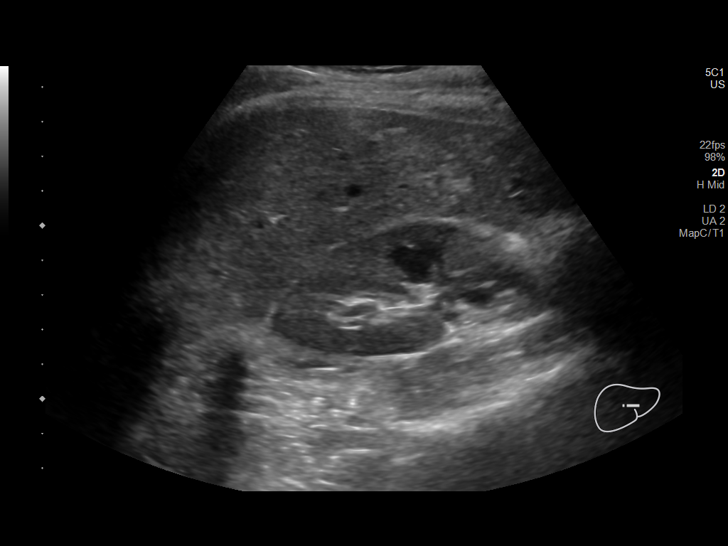
[im 79/79]
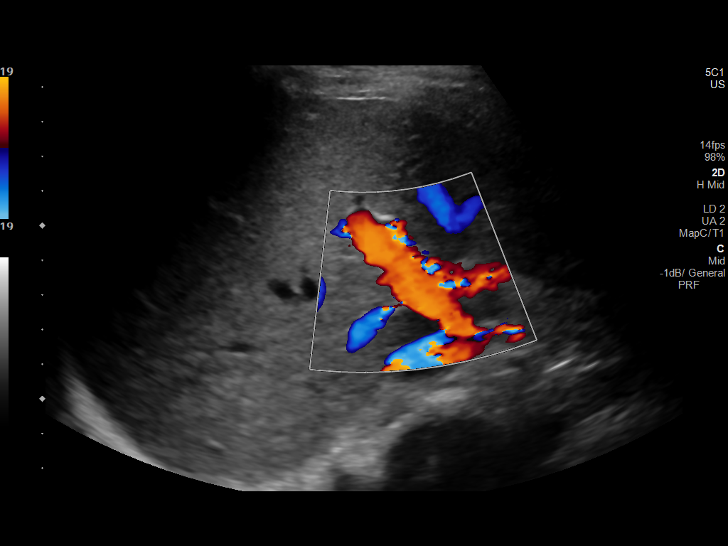

[14 of 25 positions shown; findings below may reference images not displayed]

FINDINGS: Gallbladder:

Multiple layering gallstones are present. The largest measuring
mm. Gallbladder wall thickness measures 2 mm.

Common bile duct:

Diameter: 5.6 mm

Liver:

Increased echotexture seen throughout. No focal abnormality or
biliary ductal dilatation. Portal vein is patent on color Doppler
imaging with normal direction of blood flow towards the liver.

Other: None.
IMPRESSION: Cholelithiasis without definite evidence of acute cholecystitis.

Hepatic steatosis.

## 2021-04-15 ENCOUNTER — Other Ambulatory Visit (HOSPITAL_COMMUNITY): Payer: Self-pay

## 2021-05-02 ENCOUNTER — Other Ambulatory Visit (HOSPITAL_COMMUNITY): Payer: Self-pay

## 2021-05-02 ENCOUNTER — Other Ambulatory Visit: Payer: Self-pay | Admitting: Adult Health

## 2021-05-02 MED ORDER — NORETHIN ACE-ETH ESTRAD-FE 1-20 MG-MCG PO TABS
1.0000 | ORAL_TABLET | Freq: Every day | ORAL | 0 refills | Status: DC
  Filled 2021-05-02: qty 84, 84d supply, fill #0
  Filled 2021-07-26: qty 56, 56d supply, fill #1

## 2021-05-09 ENCOUNTER — Other Ambulatory Visit (HOSPITAL_COMMUNITY): Payer: Self-pay

## 2021-06-21 ENCOUNTER — Encounter: Payer: No Typology Code available for payment source | Admitting: Nurse Practitioner

## 2021-06-21 ENCOUNTER — Encounter: Payer: No Typology Code available for payment source | Admitting: Family Medicine

## 2021-06-27 ENCOUNTER — Telehealth: Payer: Self-pay | Admitting: Nurse Practitioner

## 2021-06-27 ENCOUNTER — Other Ambulatory Visit: Payer: Self-pay

## 2021-06-27 ENCOUNTER — Other Ambulatory Visit (HOSPITAL_COMMUNITY): Payer: Self-pay

## 2021-06-27 DIAGNOSIS — E039 Hypothyroidism, unspecified: Secondary | ICD-10-CM

## 2021-06-27 MED ORDER — LEVOTHYROXINE SODIUM 50 MCG PO TABS
50.0000 ug | ORAL_TABLET | Freq: Every day | ORAL | 2 refills | Status: DC
  Filled 2021-06-27: qty 30, 30d supply, fill #0
  Filled 2021-07-26: qty 60, 60d supply, fill #1
  Filled 2021-07-27: qty 30, 30d supply, fill #1
  Filled 2021-08-25: qty 30, 30d supply, fill #2

## 2021-06-27 NOTE — Telephone Encounter (Signed)
Pt needs refill on meds    levothyroxine (SYNTHROID) 50 MCG tablet     [facility]

## 2021-06-27 NOTE — Telephone Encounter (Signed)
Refill sent.

## 2021-07-05 ENCOUNTER — Encounter: Payer: Self-pay | Admitting: Internal Medicine

## 2021-07-05 ENCOUNTER — Ambulatory Visit (INDEPENDENT_AMBULATORY_CARE_PROVIDER_SITE_OTHER): Payer: No Typology Code available for payment source | Admitting: Internal Medicine

## 2021-07-05 ENCOUNTER — Other Ambulatory Visit: Payer: Self-pay

## 2021-07-05 ENCOUNTER — Other Ambulatory Visit (HOSPITAL_COMMUNITY): Payer: Self-pay

## 2021-07-05 DIAGNOSIS — K529 Noninfective gastroenteritis and colitis, unspecified: Secondary | ICD-10-CM

## 2021-07-05 MED ORDER — AZITHROMYCIN 250 MG PO TABS
ORAL_TABLET | ORAL | 0 refills | Status: AC
  Filled 2021-07-05: qty 6, 5d supply, fill #0

## 2021-07-05 NOTE — Patient Instructions (Signed)
Please take Azithromycin as prescribed.  Please maintain adequate hydration by taking at least 64 ounces of fluid in a day.

## 2021-07-05 NOTE — Progress Notes (Signed)
Virtual Visit via Telephone Note   This visit type was conducted due to national recommendations for restrictions regarding the COVID-19 Pandemic (e.g. social distancing) in an effort to limit this patient's exposure and mitigate transmission in our community.  Due to her co-morbid illnesses, this patient is at least at moderate risk for complications without adequate follow up.  This format is felt to be most appropriate for this patient at this time.  The patient did not have access to video technology/had technical difficulties with video requiring transitioning to audio format only (telephone).  All issues noted in this document were discussed and addressed.  No physical exam could be performed with this format.  Evaluation Performed:  Follow-up visit  Date:  07/05/2021   ID:  Olivia Sellers, DOB [birth year], MRN [id number]  Patient Location: Home Provider Location: Office/Clinic  Participants: Patient Location of Patient: Home Location of Provider: Telehealth Consent was obtain for visit to be over via telehealth. I verified that I am speaking with the correct person using two identifiers.  PCP:  Heather Roberts, NP   Chief Complaint: Nausea, diarrhea and abdominal cramps  History of Present Illness:    Olivia Sellers is a 30 y.o. female who has a televisit for complaint of nausea, diarrhea and abdominal cramps for about a week now.  She started having sore throat, which later progressed to watery eyes, runny nose and cough with GI symptoms.  She has not had COVID or flu test yet.  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  The patient does not have symptoms concerning for COVID-19 infection (fever, chills, cough, or new shortness of breath).   Past Medical, Surgical, Social History, Allergies, and Medications have been Reviewed.  Past Medical History:  Diagnosis Date   Anemia    Anxiety    Cholelithiasis    Depression     Encounter for gynecological examination with Papanicolaou smear of cervix 08/20/2019   Encounter for initial prescription of contraceptive pills 02/19/2020   Encounter for Nexplanon removal 02/19/2020   General counseling and advice for contraceptive management 08/20/2019   Nexplanon insertion 09/09/2019   Pilonidal cyst    hx of   Past Surgical History:  Procedure Laterality Date   CHOLECYSTECTOMY N/A 08/22/2019   Procedure: LAPAROSCOPIC CHOLECYSTECTOMY;  Surgeon: Axel Filler, MD;  Location: [facility];  Service: General;  Laterality: N/A;   SURGERY OF LIP     suture to lip at age 47   wisdom teeth removed       Current Meds  Medication Sig   escitalopram (LEXAPRO) 20 MG tablet Take 1 tablet (20 mg total) by mouth daily.   levothyroxine (SYNTHROID) 50 MCG tablet Take 1 tablet (50 mcg total) by mouth daily before breakfast.   norethindrone-ethinyl estradiol-FE (BLISOVI FE 1/20) 1-20 MG-MCG tablet Take 1 tablet by mouth daily.   traZODone (DESYREL) 50 MG tablet Take 1 tablet (50 mg total) by mouth at bedtime as needed for sleep.     Allergies:   Patient has no known allergies.   ROS:   Please see the history of present illness.     All other systems reviewed and are negative.   Labs/Other Tests and Data Reviewed:    Recent Labs: 02/03/2021: ALT 19; Hemoglobin 14.2; Magnesium 1.9; Platelets 315 03/22/2021: BUN 13; Creatinine, Ser 0.62; Potassium 4.4; Sodium 139; TSH 2.190   Recent Lipid Panel Lab Results  Component Value Date/Time  CHOL 178 02/03/2021 02:35 PM   CHOL 189 06/18/2020 08:50 AM   TRIG 68 02/03/2021 02:35 PM   HDL 80 02/03/2021 02:35 PM   HDL 81 06/18/2020 08:50 AM   CHOLHDL 2.2 02/03/2021 02:35 PM   LDLCALC 84 02/03/2021 02:35 PM   LDLCALC 97 06/18/2020 08:50 AM    Wt Readings from Last 3 Encounters:  03/22/21 158 lb (71.7 kg)  02/03/21 157 lb (71.2 kg)  11/25/20 153 lb 1.9 oz (69.5 kg)     ASSESSMENT & PLAN:    Acute  gastroenteritis Flulike syndrome Now mainly having GI symptoms Advised to stay well-hydrated Started azithromycin since her symptoms are not present for more than a week now even with symptomatic treatment with NyQuil and cough drops Advised to contact if persistent or worsening symptoms  Time:   Today, I have spent 9 minutes reviewing the chart, including problem list, medications, and with the patient with telehealth technology discussing the above problems.   Medication Adjustments/Labs and Tests Ordered: Current medicines are reviewed at length with the patient today.  Concerns regarding medicines are outlined above.   Tests Ordered: No orders of the defined types were placed in this encounter.   Medication Changes: No orders of the defined types were placed in this encounter.    Note: This dictation was prepared with Dragon dictation along with smaller phrase technology. Similar sounding words can be transcribed inadequately or may not be corrected upon review. Any transcriptional errors that result from this process are unintentional.      Disposition:  Follow up  Signed, Anabel Halon, MD  07/05/2021 8:40 AM     [facility]

## 2021-07-26 ENCOUNTER — Other Ambulatory Visit: Payer: Self-pay | Admitting: Family Medicine

## 2021-07-26 ENCOUNTER — Other Ambulatory Visit (HOSPITAL_COMMUNITY): Payer: Self-pay

## 2021-07-26 ENCOUNTER — Other Ambulatory Visit: Payer: Self-pay | Admitting: Adult Health

## 2021-07-26 DIAGNOSIS — F418 Other specified anxiety disorders: Secondary | ICD-10-CM

## 2021-07-26 MED ORDER — ESCITALOPRAM OXALATE 20 MG PO TABS
20.0000 mg | ORAL_TABLET | Freq: Every day | ORAL | 1 refills | Status: DC
  Filled 2021-07-26: qty 90, 90d supply, fill #0
  Filled 2021-11-16: qty 90, 90d supply, fill #1

## 2021-07-27 ENCOUNTER — Other Ambulatory Visit (HOSPITAL_COMMUNITY): Payer: Self-pay

## 2021-07-27 MED ORDER — NORETHIN ACE-ETH ESTRAD-FE 1-20 MG-MCG PO TABS
1.0000 | ORAL_TABLET | Freq: Every day | ORAL | 1 refills | Status: DC
  Filled 2021-07-27 – 2021-08-04 (×2): qty 84, 84d supply, fill #0
  Filled 2022-01-16 – 2022-02-03 (×2): qty 84, 84d supply, fill #1
  Filled 2022-06-19: qty 84, 84d supply, fill #2

## 2021-07-28 ENCOUNTER — Other Ambulatory Visit (HOSPITAL_COMMUNITY): Payer: Self-pay

## 2021-08-01 ENCOUNTER — Other Ambulatory Visit (HOSPITAL_COMMUNITY): Payer: Self-pay

## 2021-08-04 ENCOUNTER — Other Ambulatory Visit (HOSPITAL_COMMUNITY): Payer: Self-pay

## 2021-08-04 ENCOUNTER — Telehealth: Payer: Self-pay

## 2021-08-04 ENCOUNTER — Other Ambulatory Visit: Payer: Self-pay | Admitting: Internal Medicine

## 2021-08-04 DIAGNOSIS — F418 Other specified anxiety disorders: Secondary | ICD-10-CM

## 2021-08-04 DIAGNOSIS — F321 Major depressive disorder, single episode, moderate: Secondary | ICD-10-CM

## 2021-08-04 MED ORDER — TRAZODONE HCL 50 MG PO TABS
50.0000 mg | ORAL_TABLET | Freq: Every evening | ORAL | 0 refills | Status: DC | PRN
  Filled 2021-08-04: qty 90, 90d supply, fill #0

## 2021-08-04 NOTE — Telephone Encounter (Signed)
Patient need med refill Trazodone 59 mg no longer get med from Doran Heater, np  Pharmacy  [facility]  [address], [place] [address]  Phone:  [phone]  Fax:  [phone]

## 2021-08-25 ENCOUNTER — Other Ambulatory Visit (HOSPITAL_COMMUNITY): Payer: Self-pay

## 2021-09-28 ENCOUNTER — Encounter: Payer: Self-pay | Admitting: Nurse Practitioner

## 2021-09-28 ENCOUNTER — Other Ambulatory Visit: Payer: Self-pay

## 2021-09-28 ENCOUNTER — Ambulatory Visit (INDEPENDENT_AMBULATORY_CARE_PROVIDER_SITE_OTHER): Payer: No Typology Code available for payment source | Admitting: Nurse Practitioner

## 2021-09-28 ENCOUNTER — Other Ambulatory Visit (HOSPITAL_COMMUNITY): Payer: Self-pay

## 2021-09-28 ENCOUNTER — Other Ambulatory Visit: Payer: Self-pay | Admitting: Nurse Practitioner

## 2021-09-28 VITALS — BP 112/76 | HR 76 | Ht 68.4 in | Wt 180.0 lb

## 2021-09-28 DIAGNOSIS — F321 Major depressive disorder, single episode, moderate: Secondary | ICD-10-CM | POA: Diagnosis not present

## 2021-09-28 DIAGNOSIS — Z0000 Encounter for general adult medical examination without abnormal findings: Secondary | ICD-10-CM

## 2021-09-28 DIAGNOSIS — Z0001 Encounter for general adult medical examination with abnormal findings: Secondary | ICD-10-CM | POA: Insufficient documentation

## 2021-09-28 DIAGNOSIS — Z23 Encounter for immunization: Secondary | ICD-10-CM | POA: Insufficient documentation

## 2021-09-28 DIAGNOSIS — E039 Hypothyroidism, unspecified: Secondary | ICD-10-CM

## 2021-09-28 MED ORDER — LEVOTHYROXINE SODIUM 50 MCG PO TABS
50.0000 ug | ORAL_TABLET | Freq: Every day | ORAL | 2 refills | Status: DC
  Filled 2021-09-28: qty 30, 30d supply, fill #0
  Filled 2021-11-01: qty 30, 30d supply, fill #1
  Filled 2021-12-06: qty 30, 30d supply, fill #2

## 2021-09-28 NOTE — Assessment & Plan Note (Signed)
Well controlled . GAD7 score 0, PHQ 9 score- 0 ?Continue lexapro 20mg  daily  ?

## 2021-09-28 NOTE — Assessment & Plan Note (Addendum)
Annual exam as documented.  ?Patient is overweight counseling done include healthy lifestyle involving committing to 150 minutes of exercise per week, heart healthy diet, and attaining healthy weight. The importance of adequate sleep also discussed.  ?Regular use of seat belt and home safety were also discussed .  Patient educated on the need to not use cell phone while driving. ?Immunization and cancer screening  needs are specifically addressed at this visit.   ?

## 2021-09-28 NOTE — Progress Notes (Signed)
Established Patient Office Visit  Subjective:  Patient ID: Olivia Sellers, female    DOB: 10/14/1990  Age: 31 y.o. MRN: [id number]  CC:  Chief Complaint  Patient presents with   Annual Exam    cpe    HPI Olivia Sellers medical history of acquired hypothyroidism, depression and anxiety presents for annual physical exam.   Pt states that she got her yearly flu vaccine at work, she needs to get covid 19 vaccine, pt encouraged to get the booster at her pharmacy.  Pt will get TDAP vaccine today  UPto date with PAP  [facility], pt states that she is due for annual exam and will call the office to schedule an appointment for this.   Patient denies any specific complaints today.  Patient stated that she has been taking all medications as prescribed.  Past Medical History:  Diagnosis Date   Anemia    Anxiety    Cholelithiasis    Depression    Encounter for gynecological examination with Papanicolaou smear of cervix 08/20/2019   Encounter for initial prescription of contraceptive pills 02/19/2020   Encounter for Nexplanon removal 02/19/2020   General counseling and advice for contraceptive management 08/20/2019   Insomnia    Nexplanon insertion 09/09/2019   Pilonidal cyst    hx of    Past Surgical History:  Procedure Laterality Date   CHOLECYSTECTOMY N/A 08/22/2019   Procedure: LAPAROSCOPIC CHOLECYSTECTOMY;  Surgeon: Axel Filler, MD;  Location: [facility];  Service: General;  Laterality: N/A;   SURGERY OF LIP     suture to lip at age 43   wisdom teeth removed      Family History  Problem Relation Age of Onset   Panic disorder Mother    Drug abuse Mother    Alcohol abuse Mother    Depression Mother    COPD Mother    Bipolar disorder Father    Alcohol abuse Father    Heart failure Father    Anemia Sister    Heart disease Paternal Grandmother    Pancreatic cancer Paternal Grandfather    Colon cancer Neg Hx     Breast cancer Neg Hx    Cervical cancer Neg Hx     Social History   Socioeconomic History   Marital status: Single    Spouse name: Not on file   Number of children: Not on file   Years of education: Not on file   Highest education level: Associate degree: academic program  Occupational History   Occupation: RN  Tobacco Use   Smoking status: Never   Smokeless tobacco: Never  Vaping Use   Vaping Use: Never used  Substance and Sexual Activity   Alcohol use: Yes    Comment: socially    Drug use: Never   Sexual activity: Yes    Birth control/protection: Pill  Other Topics Concern   Not on file  Social History Narrative   Lives with her boyfriend         Enjoys drawing      Diet:  eating fast food a lot, eating more veggies and fruits now, beans-for vegetarian like       Water: takes 2 bottles of water daily       Wears seat belt   Wears sun protection   Smoke detectors    Does not use phone while driving       Social Determinants of Dispensing optician  Resource Strain: Not on file  Food Insecurity: Not on file  Transportation Needs: Not on file  Physical Activity: Not on file  Stress: Not on file  Social Connections: Not on file  Intimate Partner Violence: Not on file    Outpatient Medications Prior to Visit  Medication Sig Dispense Refill   escitalopram (LEXAPRO) 20 MG tablet Take 1 tablet (20 mg total) by mouth daily. 90 tablet 1   levothyroxine (SYNTHROID) 50 MCG tablet Take 1 tablet (50 mcg total) by mouth daily before breakfast. 30 tablet 2   norethindrone-ethinyl estradiol-FE (BLISOVI FE 1/20) 1-20 MG-MCG tablet Take 1 tablet by mouth daily. 140 tablet 1   traZODone (DESYREL) 50 MG tablet Take 1 tablet (50 mg total) by mouth at bedtime as needed for sleep. 90 tablet 0   No facility-administered medications prior to visit.    No Known Allergies  ROS Review of Systems  Constitutional: Negative.   HENT: Negative.    Eyes: Negative.   Respiratory:  Negative.    Cardiovascular: Negative.   Gastrointestinal: Negative.   Endocrine: Negative.   Genitourinary: Negative.   Musculoskeletal: Negative.   Skin: Negative.   Allergic/Immunologic: Negative.   Neurological: Negative.   Hematological: Negative.   Psychiatric/Behavioral: Negative.       Objective:   Katie CMA was chaperone  Physical Exam Vitals and nursing note reviewed. Exam conducted with a chaperone present.  Constitutional:      General: She is not in acute distress.    Appearance: Normal appearance. She is normal weight. She is not ill-appearing, toxic-appearing or diaphoretic.  HENT:     Head: Normocephalic and atraumatic.     Right Ear: Tympanic membrane, ear canal and external ear normal. There is no impacted cerumen.     Left Ear: Tympanic membrane, ear canal and external ear normal. There is no impacted cerumen.     Nose: Nose normal. No congestion or rhinorrhea.     Mouth/Throat:     Mouth: Mucous membranes are moist.     Pharynx: Oropharynx is clear. No oropharyngeal exudate or posterior oropharyngeal erythema.  Eyes:     General: No scleral icterus.       Right eye: No discharge.        Left eye: No discharge.     Extraocular Movements: Extraocular movements intact.     Conjunctiva/sclera: Conjunctivae normal.     Pupils: Pupils are equal, round, and reactive to light.  Neck:     Vascular: No carotid bruit.  Cardiovascular:     Rate and Rhythm: Normal rate and regular rhythm.     Pulses: Normal pulses.     Heart sounds: Normal heart sounds. No murmur heard.   No friction rub. No gallop.  Pulmonary:     Effort: Pulmonary effort is normal. No respiratory distress.     Breath sounds: Normal breath sounds. No stridor. No wheezing, rhonchi or rales.  Chest:     Chest wall: No mass, lacerations, deformity, swelling, tenderness, crepitus or edema.  Breasts:    Tanner Score is 5.     Right: Normal. No swelling, bleeding, inverted nipple, mass, nipple  discharge, skin change or tenderness.     Left: Normal. No swelling, bleeding, inverted nipple, mass, nipple discharge, skin change or tenderness.  Abdominal:     General: Abdomen is flat. There is no distension.     Palpations: Abdomen is soft. There is no mass.     Tenderness: There is no abdominal tenderness. There  is no right CVA tenderness, left CVA tenderness, guarding or rebound.     Hernia: No hernia is present.  Musculoskeletal:        General: No swelling, tenderness, deformity or signs of injury.     Cervical back: Normal range of motion and neck supple. No rigidity or tenderness.     Right lower leg: No edema.     Left lower leg: No edema.  Lymphadenopathy:     Cervical: No cervical adenopathy.     Upper Body:     Right upper body: No supraclavicular, axillary or pectoral adenopathy.     Left upper body: No supraclavicular, axillary or pectoral adenopathy.  Skin:    General: Skin is warm and dry.     Capillary Refill: Capillary refill takes less than 2 seconds.     Coloration: Skin is not jaundiced or pale.     Findings: No bruising, erythema, lesion or rash.  Neurological:     General: No focal deficit present.     Mental Status: She is alert and oriented to person, place, and time.     Cranial Nerves: No cranial nerve deficit.     Sensory: No sensory deficit.     Motor: No weakness.     Coordination: Coordination normal.     Gait: Gait normal.     Deep Tendon Reflexes: Reflexes normal.  Psychiatric:        Mood and Affect: Mood normal.        Behavior: Behavior normal.        Thought Content: Thought content normal.        Judgment: Judgment normal.    BP 112/76   Pulse 76   Ht 5' 8.4" (1.737 m)   Wt 180 lb (81.6 kg)   LMP 08/26/2021 (Approximate)   SpO2 97%   BMI 27.05 kg/m  Wt Readings from Last 3 Encounters:  09/28/21 180 lb (81.6 kg)  03/22/21 158 lb (71.7 kg)  02/03/21 157 lb (71.2 kg)     Health Maintenance Due  Topic Date Due   HIV Screening   Never done   Hepatitis C Screening  Never done   COVID-19 Vaccine (3 - Booster for Moderna series) 05/25/2020   INFLUENZA VACCINE  02/28/2021    There are no preventive care reminders to display for this patient.  Lab Results  Component Value Date   TSH 2.190 03/22/2021   Lab Results  Component Value Date   WBC 8.4 02/03/2021   HGB 14.2 02/03/2021   HCT 42.5 02/03/2021   MCV 90.0 02/03/2021   PLT 315 02/03/2021   Lab Results  Component Value Date   NA 139 03/22/2021   K 4.4 03/22/2021   CO2 24 03/22/2021   GLUCOSE 111 (H) 03/22/2021   BUN 13 03/22/2021   CREATININE 0.62 03/22/2021   BILITOT 0.8 02/03/2021   ALKPHOS 58 02/03/2021   AST 19 02/03/2021   ALT 19 02/03/2021   PROT 7.2 02/03/2021   ALBUMIN 4.2 02/03/2021   CALCIUM 9.2 03/22/2021   ANIONGAP 11 02/03/2021   EGFR 124 03/22/2021   Lab Results  Component Value Date   CHOL 178 02/03/2021   Lab Results  Component Value Date   HDL 80 02/03/2021   Lab Results  Component Value Date   LDLCALC 84 02/03/2021   Lab Results  Component Value Date   TRIG 68 02/03/2021   Lab Results  Component Value Date   CHOLHDL 2.2 02/03/2021   Lab Results  Component Value Date   HGBA1C 5.0 02/03/2021      Assessment & Plan:   Problem List Items Addressed This Visit       Endocrine   Acquired hypothyroidism    Takes Synthroid 50 mcg tablets daily Check TSH levels today      Relevant Orders   TSH     Other   Depression, major, single episode, moderate (HCC)    Well controlled . GAD7 score 0, PHQ 9 score- 0 Continue lexapro 20mg  daily       Annual physical exam - Primary    Annual exam as documented.  Patient is overweight counseling done include healthy lifestyle involving committing to 150 minutes of exercise per week, heart healthy diet, and attaining healthy weight. The importance of adequate sleep also discussed.  Regular use of seat belt and home safety were also discussed .  Patient educated on  the need to not use cell phone while driving. Immunization and cancer screening  needs are specifically addressed at this visit.        Relevant Orders   Lipid Profile   HgB A1c   CBC with Differential   CMP14+EGFR   Vitamin D (25 hydroxy)   Hepatitis C Antibody   HIV antibody (with reflex)   Need for diphtheria-tetanus-pertussis (Tdap) vaccine    Patient educated on CDC recommendation for the vaccine. Verbal consent was obtained from the patient, vaccine administered by nurse, no sign of adverse reactions noted at this time. Patient education on arm soreness and use of tylenol for this patient  was discussed. Patient educated on the signs and symptoms of adverse effect and advise to contact the office if they occur       No orders of the defined types were placed in this encounter.   Follow-up: Return in about 6 months (around 03/31/2022) for depression and anxiety.    Donell Beers, FNP

## 2021-09-28 NOTE — Assessment & Plan Note (Signed)
Takes Synthroid 50 mcg tablets daily ?Check TSH levels today ?

## 2021-09-28 NOTE — Assessment & Plan Note (Signed)
Patient educated on CDC recommendation for the vaccine. Verbal consent was obtained from the patient, vaccine administered by nurse, no sign of adverse reactions noted at this time. Patient education on arm soreness and use of tylenol  for this patient  was discussed. Patient educated on the signs and symptoms of adverse effect and advise to contact the office if they occur.  ?

## 2021-09-28 NOTE — Patient Instructions (Signed)

## 2021-09-29 ENCOUNTER — Other Ambulatory Visit: Payer: Self-pay | Admitting: Nurse Practitioner

## 2021-09-29 ENCOUNTER — Other Ambulatory Visit (HOSPITAL_COMMUNITY): Payer: Self-pay

## 2021-09-29 DIAGNOSIS — E559 Vitamin D deficiency, unspecified: Secondary | ICD-10-CM

## 2021-09-29 LAB — CMP14+EGFR
ALT: 16 IU/L (ref 0–32)
AST: 17 IU/L (ref 0–40)
Albumin/Globulin Ratio: 1.7 (ref 1.2–2.2)
Albumin: 4.3 g/dL (ref 3.9–5.0)
Alkaline Phosphatase: 67 IU/L (ref 44–121)
BUN/Creatinine Ratio: 20 (ref 9–23)
BUN: 11 mg/dL (ref 6–20)
Bilirubin Total: 0.3 mg/dL (ref 0.0–1.2)
CO2: 23 mmol/L (ref 20–29)
Calcium: 9.5 mg/dL (ref 8.7–10.2)
Chloride: 102 mmol/L (ref 96–106)
Creatinine, Ser: 0.56 mg/dL — ABNORMAL LOW (ref 0.57–1.00)
Globulin, Total: 2.5 g/dL (ref 1.5–4.5)
Glucose: 94 mg/dL (ref 70–99)
Potassium: 4.4 mmol/L (ref 3.5–5.2)
Sodium: 140 mmol/L (ref 134–144)
Total Protein: 6.8 g/dL (ref 6.0–8.5)
eGFR: 126 mL/min/{1.73_m2} (ref >59)

## 2021-09-29 LAB — CBC WITH DIFFERENTIAL/PLATELET
Basophils Absolute: 0 10*3/uL (ref 0.0–0.2)
Basos: 1 %
EOS (ABSOLUTE): 0.1 10*3/uL (ref 0.0–0.4)
Eos: 1 %
Hematocrit: 39 % (ref 34.0–46.6)
Hemoglobin: 13.3 g/dL (ref 11.1–15.9)
Immature Grans (Abs): 0 10*3/uL (ref 0.0–0.1)
Immature Granulocytes: 0 %
Lymphocytes Absolute: 1.9 10*3/uL (ref 0.7–3.1)
Lymphs: 27 %
MCH: 30 pg (ref 26.6–33.0)
MCHC: 34.1 g/dL (ref 31.5–35.7)
MCV: 88 fL (ref 79–97)
Monocytes Absolute: 0.5 10*3/uL (ref 0.1–0.9)
Monocytes: 7 %
Neutrophils Absolute: 4.5 10*3/uL (ref 1.4–7.0)
Neutrophils: 64 %
Platelets: 279 10*3/uL (ref 150–450)
RBC: 4.44 x10E6/uL (ref 3.77–5.28)
RDW: 11.9 % (ref 11.7–15.4)
WBC: 7.1 10*3/uL (ref 3.4–10.8)

## 2021-09-29 LAB — HEPATITIS C ANTIBODY: Hep C Virus Ab: NONREACTIVE

## 2021-09-29 LAB — HIV ANTIBODY (ROUTINE TESTING W REFLEX): HIV Screen 4th Generation wRfx: NONREACTIVE

## 2021-09-29 LAB — LIPID PANEL
Chol/HDL Ratio: 2.3 ratio (ref 0.0–4.4)
Cholesterol, Total: 206 mg/dL — ABNORMAL HIGH (ref 100–199)
HDL: 89 mg/dL (ref >39)
LDL Chol Calc (NIH): 97 mg/dL (ref 0–99)
Triglycerides: 120 mg/dL (ref 0–149)
VLDL Cholesterol Cal: 20 mg/dL (ref 5–40)

## 2021-09-29 LAB — HEMOGLOBIN A1C
Est. average glucose Bld gHb Est-mCnc: 105 mg/dL
Hgb A1c MFr Bld: 5.3 % (ref 4.8–5.6)

## 2021-09-29 LAB — TSH: TSH: 3.3 u[IU]/mL (ref 0.450–4.500)

## 2021-09-29 LAB — VITAMIN D 25 HYDROXY (VIT D DEFICIENCY, FRACTURES): Vit D, 25-Hydroxy: 23.8 ng/mL — ABNORMAL LOW (ref 30.0–100.0)

## 2021-09-29 MED ORDER — VITAMIN D3 25 MCG (1000 UT) PO CAPS
1000.0000 [IU] | ORAL_CAPSULE | Freq: Every day | ORAL | 3 refills | Status: AC
  Filled 2021-09-29: qty 60, 60d supply, fill #0

## 2021-09-29 NOTE — Progress Notes (Signed)
Please review results with patient ?TSH is stable continue Synthroid 50 mcg tablets daily ?Liver function kidney function is normal ?Lipid panel is normal ?Normal A1c ?Vitamin D level is low, patient should start taking vitamin D 1000 units daily, recheck labs at next visit ?HIV screening was negative, hep C negative

## 2021-11-01 ENCOUNTER — Other Ambulatory Visit (HOSPITAL_COMMUNITY): Payer: Self-pay

## 2021-11-16 ENCOUNTER — Other Ambulatory Visit (HOSPITAL_COMMUNITY): Payer: Self-pay

## 2021-12-06 ENCOUNTER — Other Ambulatory Visit (HOSPITAL_COMMUNITY): Payer: Self-pay

## 2021-12-20 ENCOUNTER — Telehealth: Payer: No Typology Code available for payment source | Admitting: Family Medicine

## 2021-12-20 DIAGNOSIS — N898 Other specified noninflammatory disorders of vagina: Secondary | ICD-10-CM

## 2021-12-20 NOTE — Progress Notes (Signed)
[place]   Vaginal discharge needs testing to identify best treatment plan, message of this was detailed and sent on this EV for in person office appt needs.

## 2021-12-29 ENCOUNTER — Other Ambulatory Visit (INDEPENDENT_AMBULATORY_CARE_PROVIDER_SITE_OTHER): Payer: No Typology Code available for payment source

## 2021-12-29 ENCOUNTER — Other Ambulatory Visit (HOSPITAL_COMMUNITY)
Admission: RE | Admit: 2021-12-29 | Discharge: 2021-12-29 | Disposition: A | Discharge status: Home / Self Care | Payer: No Typology Code available for payment source | Source: Ambulatory Visit | Attending: Obstetrics & Gynecology | Admitting: Obstetrics & Gynecology

## 2021-12-29 DIAGNOSIS — Z113 Encounter for screening for infections with a predominantly sexual mode of transmission: Secondary | ICD-10-CM | POA: Diagnosis present

## 2021-12-29 DIAGNOSIS — N898 Other specified noninflammatory disorders of vagina: Secondary | ICD-10-CM

## 2021-12-29 DIAGNOSIS — R399 Unspecified symptoms and signs involving the genitourinary system: Secondary | ICD-10-CM

## 2021-12-29 LAB — POCT URINALYSIS DIPSTICK OB
Blood, UA: NEGATIVE
Glucose, UA: NEGATIVE
Ketones, UA: NEGATIVE
Nitrite, UA: NEGATIVE
POC,PROTEIN,UA: NEGATIVE

## 2021-12-29 NOTE — Progress Notes (Signed)
   NURSE VISIT- VAGINITIS/STD  SUBJECTIVE:  Olivia Sellers is a 31 y.o. G0P0000 GYN patientfemale here for a vaginal swab for vaginitis screening, STD screen.  She reports the following symptoms: burning, discharge described as curd-like, malodorous, and green, odor, urinary symptoms of urinary urgency and burning with urination, and vulvar itching for 1 month. Denies abnormal vaginal bleeding, significant pelvic pain, fever, or UTI symptoms.  OBJECTIVE:  There were no vitals taken for this visit.  Appears well, in no apparent distress  ASSESSMENT: Vaginal swab for  vaginitis & STD screening  PLAN: Self-collected vaginal probe for Gonorrhea, Chlamydia, Trichomonas, Bacterial Vaginosis, Yeast sent to lab Treatment: to be determined once results are received Follow-up as needed if symptoms persist/worsen, or new symptoms develop    NURSE VISIT- UTI SYMPTOMS   SUBJECTIVE:  Olivia Sellers is a 31 y.o. G0P0000 female here for UTI symptoms. She is a GYN patient. She reports urinary urgency and burning with urination .  OBJECTIVE:  There were no vitals taken for this visit.  Appears well, in no apparent distress  Results for orders placed or performed in visit on 12/29/21 (from the past 24 hour(s))  POC Urinalysis Dipstick OB   Collection Time: 12/29/21  2:59 PM  Result Value Ref Range   Color, UA     Clarity, UA     Glucose, UA Negative Negative   Bilirubin, UA     Ketones, UA neg    Spec Grav, UA     Blood, UA neg    pH, UA     POC,PROTEIN,UA Negative Negative, Trace, Small (1+), Moderate (2+), Large (3+), 4+   Urobilinogen, UA     Nitrite, UA neg    Leukocytes, UA Trace (A) Negative   Appearance     Odor      ASSESSMENT: GYN patient with UTI symptoms and negative nitrites  PLAN: Discussed with Cyril Mourning, AGNP   Rx sent by provider today: No Urine culture sent Call or return to clinic prn if these symptoms worsen or fail to improve as  anticipated. Follow-up: as needed   Princella Jaskiewicz A Raliyah Montella  12/29/2021 2:59 PM

## 2021-12-30 LAB — URINALYSIS, ROUTINE W REFLEX MICROSCOPIC
Bilirubin, UA: NEGATIVE
Glucose, UA: NEGATIVE
Ketones, UA: NEGATIVE
Nitrite, UA: NEGATIVE
Protein,UA: NEGATIVE
RBC, UA: NEGATIVE
Specific Gravity, UA: 1.011 (ref 1.005–1.030)
Urobilinogen, Ur: 1 mg/dL (ref 0.2–1.0)
pH, UA: 7 (ref 5.0–7.5)

## 2021-12-30 LAB — MICROSCOPIC EXAMINATION
Bacteria, UA: NONE SEEN
Casts: NONE SEEN /lpf
Epithelial Cells (non renal): NONE SEEN /hpf (ref 0–10)
RBC, Urine: NONE SEEN /hpf (ref 0–2)

## 2021-12-31 LAB — URINE CULTURE: Organism ID, Bacteria: NO GROWTH

## 2022-01-02 ENCOUNTER — Telehealth: Payer: Self-pay

## 2022-01-02 LAB — CERVICOVAGINAL ANCILLARY ONLY
Bacterial Vaginitis (gardnerella): POSITIVE — AB
Candida Glabrata: NEGATIVE
Candida Vaginitis: NEGATIVE
Chlamydia: NEGATIVE
Comment: NEGATIVE
Comment: NEGATIVE
Comment: NEGATIVE
Comment: NEGATIVE
Comment: NEGATIVE
Comment: NORMAL
Neisseria Gonorrhea: NEGATIVE
Trichomonas: NEGATIVE

## 2022-01-02 NOTE — Telephone Encounter (Signed)
PT CALLED AND STATED THAT SHE WANTS A NURSE TO CALL HER ABOUT HER TEST RESULTS.

## 2022-01-02 NOTE — Telephone Encounter (Signed)
Pt aware no growth on urine culture. Advised to drink plenty of water to keep hydrated. Still waiting on CV swab. Pt voiced understanding. JSY

## 2022-01-05 ENCOUNTER — Other Ambulatory Visit (HOSPITAL_COMMUNITY): Payer: Self-pay

## 2022-01-05 ENCOUNTER — Telehealth: Payer: Self-pay

## 2022-01-05 MED ORDER — METRONIDAZOLE 500 MG PO TABS
500.0000 mg | ORAL_TABLET | Freq: Two times a day (BID) | ORAL | 0 refills | Status: DC
  Filled 2022-01-05: qty 14, 7d supply, fill #0

## 2022-01-05 NOTE — Telephone Encounter (Signed)
Rx sent in for flagyl had +BV on vaginal swab

## 2022-01-05 NOTE — Telephone Encounter (Signed)
Pt aware med will be sent in for BV. Advised no sex and no alcohol while taking med. Thanks!! JSY

## 2022-01-05 NOTE — Telephone Encounter (Signed)
Pt called about her test results, wants to know if she will be called in something for BV.

## 2022-01-16 ENCOUNTER — Other Ambulatory Visit: Payer: Self-pay | Admitting: Nurse Practitioner

## 2022-01-16 ENCOUNTER — Other Ambulatory Visit (HOSPITAL_COMMUNITY): Payer: Self-pay

## 2022-01-16 DIAGNOSIS — E039 Hypothyroidism, unspecified: Secondary | ICD-10-CM

## 2022-01-16 MED ORDER — LEVOTHYROXINE SODIUM 50 MCG PO TABS
50.0000 ug | ORAL_TABLET | Freq: Every day | ORAL | 2 refills | Status: DC
  Filled 2022-01-16 – 2022-02-03 (×2): qty 30, 30d supply, fill #0
  Filled 2022-03-31: qty 30, 30d supply, fill #1
  Filled 2022-05-02: qty 30, 30d supply, fill #2

## 2022-01-24 ENCOUNTER — Other Ambulatory Visit (HOSPITAL_COMMUNITY): Payer: Self-pay

## 2022-02-03 ENCOUNTER — Other Ambulatory Visit (HOSPITAL_COMMUNITY): Payer: Self-pay

## 2022-03-02 ENCOUNTER — Other Ambulatory Visit: Payer: Self-pay | Admitting: Nurse Practitioner

## 2022-03-02 ENCOUNTER — Other Ambulatory Visit (HOSPITAL_COMMUNITY): Payer: Self-pay

## 2022-03-02 DIAGNOSIS — F418 Other specified anxiety disorders: Secondary | ICD-10-CM

## 2022-03-03 ENCOUNTER — Other Ambulatory Visit (HOSPITAL_COMMUNITY): Payer: Self-pay

## 2022-03-03 MED ORDER — ESCITALOPRAM OXALATE 20 MG PO TABS
20.0000 mg | ORAL_TABLET | Freq: Every day | ORAL | 1 refills | Status: DC
  Filled 2022-03-03: qty 90, 90d supply, fill #0
  Filled 2022-06-19: qty 90, 90d supply, fill #1

## 2022-03-31 ENCOUNTER — Other Ambulatory Visit (HOSPITAL_COMMUNITY): Payer: Self-pay

## 2022-04-05 ENCOUNTER — Ambulatory Visit: Payer: No Typology Code available for payment source | Admitting: Nurse Practitioner

## 2022-04-07 ENCOUNTER — Other Ambulatory Visit (HOSPITAL_COMMUNITY): Payer: Self-pay

## 2022-05-02 ENCOUNTER — Other Ambulatory Visit (HOSPITAL_COMMUNITY): Payer: Self-pay

## 2022-06-19 ENCOUNTER — Telehealth: Payer: Self-pay | Admitting: Internal Medicine

## 2022-06-19 ENCOUNTER — Other Ambulatory Visit (HOSPITAL_COMMUNITY): Payer: Self-pay

## 2022-06-19 ENCOUNTER — Other Ambulatory Visit: Payer: Self-pay | Admitting: Nurse Practitioner

## 2022-06-19 ENCOUNTER — Other Ambulatory Visit: Payer: Self-pay

## 2022-06-19 DIAGNOSIS — E039 Hypothyroidism, unspecified: Secondary | ICD-10-CM

## 2022-06-19 MED ORDER — LEVOTHYROXINE SODIUM 50 MCG PO TABS
50.0000 ug | ORAL_TABLET | Freq: Every day | ORAL | 2 refills | Status: DC
  Filled 2022-06-19: qty 30, 30d supply, fill #0

## 2022-06-19 MED ORDER — LEVOTHYROXINE SODIUM 50 MCG PO TABS
50.0000 ug | ORAL_TABLET | Freq: Every day | ORAL | 2 refills | Status: DC
  Filled 2022-06-19: qty 30, 30d supply, fill #0
  Filled 2022-07-25: qty 30, 30d supply, fill #1
  Filled 2022-08-30: qty 30, 30d supply, fill #2

## 2022-06-19 NOTE — Telephone Encounter (Signed)
Was Olivia Sellers pt. Sche to be seen in March. Needs refill.    Prescription Request  06/19/2022  Is this a "Controlled Substance" medicine? No  LOV: 06/19/2022   What is the name of the medication or equipment? levothyroxine (SYNTHROID) 50 MCG tablet   Have you contacted your pharmacy to request a refill? Yes   Which pharmacy would you like this sent to?  [facility] [address] [place] [address] Phone: [phone] Fax: [phone]   Patient notified that their request is being sent to the clinical staff for review and that they should receive a response within 2 business days.   Please advise at [phone] (mobile)

## 2022-07-06 ENCOUNTER — Other Ambulatory Visit (HOSPITAL_COMMUNITY): Payer: Self-pay

## 2022-07-06 ENCOUNTER — Telehealth: Payer: No Typology Code available for payment source | Admitting: Family Medicine

## 2022-07-06 DIAGNOSIS — R3989 Other symptoms and signs involving the genitourinary system: Secondary | ICD-10-CM | POA: Diagnosis not present

## 2022-07-06 MED ORDER — NITROFURANTOIN MONOHYD MACRO 100 MG PO CAPS
100.0000 mg | ORAL_CAPSULE | Freq: Two times a day (BID) | ORAL | 0 refills | Status: AC
  Filled 2022-07-06: qty 10, 5d supply, fill #0

## 2022-07-06 NOTE — Progress Notes (Signed)

## 2022-07-10 ENCOUNTER — Telehealth: Payer: No Typology Code available for payment source | Admitting: Nurse Practitioner

## 2022-07-10 ENCOUNTER — Other Ambulatory Visit (HOSPITAL_COMMUNITY): Payer: Self-pay

## 2022-07-10 DIAGNOSIS — N760 Acute vaginitis: Secondary | ICD-10-CM

## 2022-07-10 MED ORDER — METRONIDAZOLE 500 MG PO TABS
500.0000 mg | ORAL_TABLET | Freq: Two times a day (BID) | ORAL | 0 refills | Status: AC
  Filled 2022-07-10: qty 14, 7d supply, fill #0

## 2022-07-10 NOTE — Progress Notes (Signed)
E-Visit for Vaginal Symptoms  We are sorry that you are not feeling well. Here is how we plan to help! Based on what you shared with me it looks like you: May have a vaginosis due to bacteria  Vaginosis is an inflammation of the vagina that can result in discharge, itching and pain. The cause is usually a change in the normal balance of vaginal bacteria or an infection. Vaginosis can also result from reduced estrogen levels after menopause.  The most common causes of vaginosis are:   Bacterial vaginosis which results from an overgrowth of one on several organisms that are normally present in your vagina.   Yeast infections which are caused by a naturally occurring fungus called candida.   Vaginal atrophy (atrophic vaginosis) which results from the thinning of the vagina from reduced estrogen levels after menopause.   Trichomoniasis which is caused by a parasite and is commonly transmitted by sexual intercourse.  Factors that increase your risk of developing vaginosis include: Medications, such as antibiotics and steroids Uncontrolled diabetes Use of hygiene products such as bubble bath, vaginal spray or vaginal deodorant Douching Wearing damp or tight-fitting clothing Using an intrauterine device (IUD) for birth control Hormonal changes, such as those associated with pregnancy, birth control pills or menopause Sexual activity Having a sexually transmitted infection  Your treatment plan is Metronidazole or Flagyl 500mg twice a day for 7 days.  I have electronically sent this prescription into the pharmacy that you have chosen.  Be sure to take all of the medication as directed. Stop taking any medication if you develop a rash, tongue swelling or shortness of breath. Mothers who are breast feeding should consider pumping and discarding their breast milk while on these antibiotics. However, there is no consensus that infant exposure at these doses would be harmful.  Remember that  medication creams can weaken latex condoms. .   HOME CARE:  Good hygiene may prevent some types of vaginosis from recurring and may relieve some symptoms:  Avoid baths, hot tubs and whirlpool spas. Rinse soap from your outer genital area after a shower, and dry the area well to prevent irritation. Don't use scented or harsh soaps, such as those with deodorant or antibacterial action. Avoid irritants. These include scented tampons and pads. Wipe from front to back after using the toilet. Doing so avoids spreading fecal bacteria to your vagina.  Other things that may help prevent vaginosis include:  Don't douche. Your vagina doesn't require cleansing other than normal bathing. Repetitive douching disrupts the normal organisms that reside in the vagina and can actually increase your risk of vaginal infection. Douching won't clear up a vaginal infection. Use a latex condom. Both female and female latex condoms may help you avoid infections spread by sexual contact. Wear cotton underwear. Also wear pantyhose with a cotton crotch. If you feel comfortable without it, skip wearing underwear to bed. Yeast thrives in moist environments Your symptoms should improve in the next day or two.  GET HELP RIGHT AWAY IF:  You have pain in your lower abdomen ( pelvic area or over your ovaries) You develop nausea or vomiting You develop a fever Your discharge changes or worsens You have persistent pain with intercourse You develop shortness of breath, a rapid pulse, or you faint.  These symptoms could be signs of problems or infections that need to be evaluated by a medical provider now.  MAKE SURE YOU   Understand these instructions. Will watch your condition. Will get help right   away if you are not doing well or get worse.  Thank you for choosing an e-visit.  Your e-visit answers were reviewed by a board certified advanced clinical practitioner to complete your personal care plan. Depending upon the  condition, your plan could have included both over the counter or prescription medications.  Please review your pharmacy choice. Make sure the pharmacy is open so you can pick up prescription now. If there is a problem, you may contact your provider through [employer] and have the prescription routed to another pharmacy.  Your safety is important to [place]. If you have drug allergies check your prescription carefully.   For the next 24 hours you can use MyChart to ask questions about today's visit, request a non-urgent call back, or ask for a work or school excuse. You will get an email in the next two days asking about your experience. I hope that your e-visit has been valuable and will speed your recovery.   Meds ordered this encounter  Medications   metroNIDAZOLE (FLAGYL) 500 MG tablet    Sig: Take 1 tablet (500 mg total) by mouth 2 (two) times daily for 7 days.    Dispense:  14 tablet    Refill:  0    I spent approximately 7 minutes reviewing the patient's history, current symptoms and coordinating their plan of care today.

## 2022-10-02 ENCOUNTER — Other Ambulatory Visit: Payer: Self-pay | Admitting: Internal Medicine

## 2022-10-02 ENCOUNTER — Other Ambulatory Visit: Payer: Self-pay | Admitting: Nurse Practitioner

## 2022-10-02 ENCOUNTER — Other Ambulatory Visit (HOSPITAL_COMMUNITY): Payer: Self-pay

## 2022-10-02 DIAGNOSIS — F418 Other specified anxiety disorders: Secondary | ICD-10-CM

## 2022-10-02 DIAGNOSIS — E039 Hypothyroidism, unspecified: Secondary | ICD-10-CM

## 2022-10-02 MED ORDER — LEVOTHYROXINE SODIUM 50 MCG PO TABS
50.0000 ug | ORAL_TABLET | Freq: Every day | ORAL | 2 refills | Status: DC
  Filled 2022-10-02: qty 30, 30d supply, fill #0
  Filled 2022-11-02: qty 30, 30d supply, fill #1
  Filled 2022-12-11: qty 30, 30d supply, fill #2

## 2022-10-02 MED ORDER — ESCITALOPRAM OXALATE 20 MG PO TABS
20.0000 mg | ORAL_TABLET | Freq: Every day | ORAL | 1 refills | Status: DC
  Filled 2022-10-02: qty 90, 90d supply, fill #0
  Filled 2023-01-11: qty 90, 90d supply, fill #1

## 2022-10-03 ENCOUNTER — Other Ambulatory Visit (HOSPITAL_COMMUNITY): Payer: Self-pay

## 2022-10-04 ENCOUNTER — Ambulatory Visit (INDEPENDENT_AMBULATORY_CARE_PROVIDER_SITE_OTHER): Payer: 59 | Admitting: Internal Medicine

## 2022-10-04 ENCOUNTER — Encounter: Payer: Self-pay | Admitting: Internal Medicine

## 2022-10-04 ENCOUNTER — Encounter: Payer: No Typology Code available for payment source | Admitting: Nurse Practitioner

## 2022-10-04 VITALS — BP 117/78 | HR 71 | Ht 68.0 in | Wt 169.8 lb

## 2022-10-04 DIAGNOSIS — Z131 Encounter for screening for diabetes mellitus: Secondary | ICD-10-CM | POA: Diagnosis not present

## 2022-10-04 DIAGNOSIS — F321 Major depressive disorder, single episode, moderate: Secondary | ICD-10-CM | POA: Diagnosis not present

## 2022-10-04 DIAGNOSIS — E039 Hypothyroidism, unspecified: Secondary | ICD-10-CM | POA: Diagnosis not present

## 2022-10-04 DIAGNOSIS — Z1321 Encounter for screening for nutritional disorder: Secondary | ICD-10-CM

## 2022-10-04 DIAGNOSIS — Z13220 Encounter for screening for lipoid disorders: Secondary | ICD-10-CM | POA: Diagnosis not present

## 2022-10-04 DIAGNOSIS — Z0001 Encounter for general adult medical examination with abnormal findings: Secondary | ICD-10-CM

## 2022-10-04 NOTE — Assessment & Plan Note (Signed)
Annual exam completed today.  Interval records and labs have been reviewed. -Repeat labs ordered today -She will schedule follow-up with her OB/GYN for Pap smear -Additional preventative care items are up-to-date -We will tentatively plan for follow-up in 1 year

## 2022-10-04 NOTE — Patient Instructions (Signed)
It was a pleasure to see you today.  Thank you for giving [place] the opportunity to be involved in your care.  Below is a brief recap of your visit and next steps.  We will plan to see you again in 1 year.  Summary We completed your annual exam today. Repeat labs have been ordered. We will tentatively plan for follow up in 1 year.

## 2022-10-04 NOTE — Assessment & Plan Note (Signed)
She is prescribed levothyroxine 50 mcg daily.  Asymptomatic currently. -Repeat thyroid studies ordered today

## 2022-10-04 NOTE — Assessment & Plan Note (Signed)
Mood stable currently.  PHQ-9 score 5 today.  Denies SI/HI.  Feels as though Lexapro 20 mg daily is working well. -No medication changes today

## 2022-10-04 NOTE — Progress Notes (Signed)
Complete physical exam  Patient: Olivia Sellers   DOB: Apr 01, 1991   32 y.o. Female  MRN: [id number]  Subjective:    Chief Complaint  Patient presents with   Annual Exam   Olivia Sellers is a 32 y.o. female who presents today for a complete physical exam. She reports consuming a general healthy diet and intermittently fasting. Home exercise routine includes treadmill and walking 1 hrs per day. She generally feels well. She reports sleeping fairly well. She does not have additional problems to discuss today.   Most recent fall risk assessment:    10/04/2022    1:08 PM  [place] in the past year? 0  Number falls in past yr: 0  Injury with Fall? 0  Risk for fall due to : No Fall Risks  Follow up Falls evaluation completed    Most recent depression screenings:    10/04/2022    1:08 PM 09/28/2021    8:50 AM  PHQ 2/9 Scores  PHQ - 2 Score 1 0  PHQ- 9 Score 5    Vision:Within last year and Dental: No current dental problems and Receives regular dental care  Past Medical History:  Diagnosis Date   Anemia    Anxiety    Cholelithiasis    Depression    Encounter for gynecological examination with Papanicolaou smear of cervix 08/20/2019   Encounter for initial prescription of contraceptive pills 02/19/2020   Encounter for Nexplanon removal 02/19/2020   General counseling and advice for contraceptive management 08/20/2019   Hyperlipidemia 2021   Insomnia    Nexplanon insertion 09/09/2019   Pilonidal cyst    hx of   Thyroid disease 2021   Past Surgical History:  Procedure Laterality Date   CHOLECYSTECTOMY N/A 08/22/2019   Procedure: LAPAROSCOPIC CHOLECYSTECTOMY;  Surgeon: Ralene Ok, MD;  Location: [facility];  Service: General;  Laterality: N/A;   SURGERY OF LIP     suture to lip at age 35   wisdom teeth removed     Social History   Tobacco Use   Smoking status: Never   Smokeless tobacco: Never  Vaping Use   Vaping Use: Never used   Substance Use Topics   Alcohol use: Yes    Alcohol/week: 1.0 standard drink of alcohol    Types: 1 Glasses of wine per week    Comment: socially    Drug use: Never   Family History  Problem Relation Age of Onset   Panic disorder Mother    Drug abuse Mother    Alcohol abuse Mother    Depression Mother    COPD Mother    Bipolar disorder Father    Alcohol abuse Father    Heart failure Father    Anemia Sister    Heart disease Paternal Grandmother    Pancreatic cancer Paternal Grandfather    Colon cancer Neg Hx    Breast cancer Neg Hx    Cervical cancer Neg Hx    No Known Allergies   Patient Care Team: Johnette Abraham, MD as PCP - General (Internal Medicine)   Outpatient Medications Prior to Visit  Medication Sig   Cholecalciferol (VITAMIN D3) 25 MCG (1000 UT) CAPS Take 1 capsule (1,000 Units total) by mouth daily.   escitalopram (LEXAPRO) 20 MG tablet Take 1 tablet (20 mg total) by mouth daily.   levothyroxine (SYNTHROID) 50 MCG tablet Take 1 tablet (50 mcg total) by mouth daily before breakfast.   [DISCONTINUED] metroNIDAZOLE (FLAGYL) 500  MG tablet Take 1 tablet (500 mg total) by mouth 2 (two) times daily.   [DISCONTINUED] norethindrone-ethinyl estradiol-FE (BLISOVI FE 1/20) 1-20 MG-MCG tablet Take 1 tablet by mouth daily.   [DISCONTINUED] traZODone (DESYREL) 50 MG tablet Take 1 tablet (50 mg total) by mouth at bedtime as needed for sleep.   No facility-administered medications prior to visit.   Review of Systems  Constitutional:  Negative for chills and fever.  HENT:  Negative for sore throat.   Respiratory:  Negative for cough and shortness of breath.   Cardiovascular:  Negative for chest pain, palpitations and leg swelling.  Gastrointestinal:  Negative for abdominal pain, blood in stool, constipation, diarrhea, nausea and vomiting.  Genitourinary:  Negative for dysuria and hematuria.  Musculoskeletal:  Negative for myalgias.  Skin:  Negative for itching and rash.   Neurological:  Negative for dizziness and headaches.  Psychiatric/Behavioral:  Negative for depression and suicidal ideas.       Objective:     BP 117/78   Pulse 71   Ht '5\' 8"'$  (1.727 m)   Wt 169 lb 12.8 oz (77 kg)   SpO2 97%   BMI 25.82 kg/m  BP Readings from Last 3 Encounters:  10/04/22 117/78  09/28/21 112/76  03/22/21 108/71   Physical Exam Vitals reviewed.  Constitutional:      General: She is not in acute distress.    Appearance: Normal appearance. She is not toxic-appearing.  HENT:     Head: Normocephalic and atraumatic.     Right Ear: External ear normal.     Left Ear: External ear normal.     Nose: Nose normal. No congestion or rhinorrhea.     Mouth/Throat:     Mouth: Mucous membranes are moist.     Pharynx: Oropharynx is clear. No oropharyngeal exudate or posterior oropharyngeal erythema.  Eyes:     General: No scleral icterus.    Extraocular Movements: Extraocular movements intact.     Conjunctiva/sclera: Conjunctivae normal.     Pupils: Pupils are equal, round, and reactive to light.  Cardiovascular:     Rate and Rhythm: Normal rate and regular rhythm.     Pulses: Normal pulses.     Heart sounds: Normal heart sounds. No murmur heard.    No friction rub. No gallop.  Pulmonary:     Effort: Pulmonary effort is normal.     Breath sounds: Normal breath sounds. No wheezing, rhonchi or rales.  Abdominal:     General: Abdomen is flat. Bowel sounds are normal. There is no distension.     Palpations: Abdomen is soft.     Tenderness: There is no abdominal tenderness.  Musculoskeletal:        General: No swelling. Normal range of motion.     Cervical back: Normal range of motion.     Right lower leg: No edema.     Left lower leg: No edema.  Lymphadenopathy:     Cervical: No cervical adenopathy.  Skin:    General: Skin is warm and dry.     Capillary Refill: Capillary refill takes less than 2 seconds.     Coloration: Skin is not jaundiced.  Neurological:      General: No focal deficit present.     Mental Status: She is alert and oriented to person, place, and time.  Psychiatric:        Mood and Affect: Mood normal.        Behavior: Behavior normal.   Last CBC Lab Results  Component Value Date   WBC 7.1 09/28/2021   HGB 13.3 09/28/2021   HCT 39.0 09/28/2021   MCV 88 09/28/2021   MCH 30.0 09/28/2021   RDW 11.9 09/28/2021   PLT 279 [id number]   Last metabolic panel Lab Results  Component Value Date   GLUCOSE 94 09/28/2021   NA 140 09/28/2021   K 4.4 09/28/2021   CL 102 09/28/2021   CO2 23 09/28/2021   BUN 11 09/28/2021   CREATININE 0.56 (L) 09/28/2021   EGFR 126 09/28/2021   CALCIUM 9.5 09/28/2021   PROT 6.8 09/28/2021   ALBUMIN 4.3 09/28/2021   LABGLOB 2.5 09/28/2021   AGRATIO 1.7 09/28/2021   BILITOT 0.3 09/28/2021   ALKPHOS 67 09/28/2021   AST 17 09/28/2021   ALT 16 09/28/2021   ANIONGAP 11 02/03/2021   Last lipids Lab Results  Component Value Date   CHOL 206 (H) 09/28/2021   HDL 89 09/28/2021   LDLCALC 97 09/28/2021   TRIG 120 09/28/2021   CHOLHDL 2.3 09/28/2021   Last hemoglobin A1c Lab Results  Component Value Date   HGBA1C 5.3 09/28/2021   Last thyroid functions Lab Results  Component Value Date   TSH 3.300 09/28/2021   T4TOTAL 7.3 06/23/2020   Last vitamin D Lab Results  Component Value Date   VD25OH 23.8 (L) 09/28/2021       Assessment & Plan:    Routine Health Maintenance and Physical Exam  Immunization History  Administered Date(s) Administered   Influenza,inj,Quad PF,6+ Mos 03/31/2020   Moderna Sars-Covid-2 Vaccination 03/02/2020, 03/30/2020   Tdap 09/28/2021    Health Maintenance  Topic Date Due   COVID-19 Vaccine (3 - 2023-24 season) 03/31/2022   PAP SMEAR-Modifier  08/19/2022   DTaP/Tdap/Td (2 - Td or Tdap) 09/29/2031   INFLUENZA VACCINE  Completed   Hepatitis C Screening  Completed   HIV Screening  Completed   HPV VACCINES  Aged Out    Discussed health benefits of  physical activity, and encouraged her to engage in regular exercise appropriate for her age and condition.  Problem List Items Addressed This Visit       Acquired hypothyroidism    She is prescribed levothyroxine 50 mcg daily.  Asymptomatic currently. -Repeat thyroid studies ordered today      Depression, major, single episode, moderate (HCC)    Mood stable currently.  PHQ-9 score 5 today.  Denies SI/HI.  Feels as though Lexapro 20 mg daily is working well. -No medication changes today      Encounter for well adult exam with abnormal findings - Primary    Annual exam completed today.  Interval records and labs have been reviewed. -Repeat labs ordered today -She will schedule follow-up with her OB/GYN for Pap smear -Additional preventative care items are up-to-date -We will tentatively plan for follow-up in 1 year      Return in about 1 year (around 10/04/2023).  Johnette Abraham, MD

## 2022-10-05 ENCOUNTER — Other Ambulatory Visit: Payer: Self-pay | Admitting: Internal Medicine

## 2022-10-05 ENCOUNTER — Other Ambulatory Visit (HOSPITAL_COMMUNITY): Payer: Self-pay

## 2022-10-05 DIAGNOSIS — E559 Vitamin D deficiency, unspecified: Secondary | ICD-10-CM

## 2022-10-05 MED ORDER — VITAMIN D (ERGOCALCIFEROL) 1.25 MG (50000 UNIT) PO CAPS
50000.0000 [IU] | ORAL_CAPSULE | ORAL | 0 refills | Status: DC
  Filled 2022-10-05: qty 12, 84d supply, fill #0

## 2022-10-06 LAB — CBC WITH DIFFERENTIAL/PLATELET
Basophils Absolute: 0 10*3/uL (ref 0.0–0.2)
Basos: 1 %
EOS (ABSOLUTE): 0.1 10*3/uL (ref 0.0–0.4)
Eos: 1 %
Hematocrit: 37.8 % (ref 34.0–46.6)
Hemoglobin: 12.8 g/dL (ref 11.1–15.9)
Immature Grans (Abs): 0 10*3/uL (ref 0.0–0.1)
Immature Granulocytes: 0 %
Lymphocytes Absolute: 2.4 10*3/uL (ref 0.7–3.1)
Lymphs: 32 %
MCH: 30 pg (ref 26.6–33.0)
MCHC: 33.9 g/dL (ref 31.5–35.7)
MCV: 89 fL (ref 79–97)
Monocytes Absolute: 0.5 10*3/uL (ref 0.1–0.9)
Monocytes: 6 %
Neutrophils Absolute: 4.4 10*3/uL (ref 1.4–7.0)
Neutrophils: 60 %
Platelets: 301 10*3/uL (ref 150–450)
RBC: 4.26 x10E6/uL (ref 3.77–5.28)
RDW: 12.2 % (ref 11.7–15.4)
WBC: 7.4 10*3/uL (ref 3.4–10.8)

## 2022-10-06 LAB — CMP14+EGFR
ALT: 17 IU/L (ref 0–32)
AST: 16 IU/L (ref 0–40)
Albumin/Globulin Ratio: 2 (ref 1.2–2.2)
Albumin: 4.4 g/dL (ref 3.9–4.9)
Alkaline Phosphatase: 68 IU/L (ref 44–121)
BUN/Creatinine Ratio: 16 (ref 9–23)
BUN: 10 mg/dL (ref 6–20)
Bilirubin Total: 0.5 mg/dL (ref 0.0–1.2)
CO2: 23 mmol/L (ref 20–29)
Calcium: 9 mg/dL (ref 8.7–10.2)
Chloride: 103 mmol/L (ref 96–106)
Creatinine, Ser: 0.62 mg/dL (ref 0.57–1.00)
Globulin, Total: 2.2 g/dL (ref 1.5–4.5)
Glucose: 86 mg/dL (ref 70–99)
Potassium: 4 mmol/L (ref 3.5–5.2)
Sodium: 137 mmol/L (ref 134–144)
Total Protein: 6.6 g/dL (ref 6.0–8.5)
eGFR: 122 mL/min/{1.73_m2} (ref >59)

## 2022-10-06 LAB — LIPID PANEL
Chol/HDL Ratio: 2.4 ratio (ref 0.0–4.4)
Cholesterol, Total: 162 mg/dL (ref 100–199)
HDL: 67 mg/dL (ref >39)
LDL Chol Calc (NIH): 81 mg/dL (ref 0–99)
Triglycerides: 72 mg/dL (ref 0–149)
VLDL Cholesterol Cal: 14 mg/dL (ref 5–40)

## 2022-10-06 LAB — TSH+FREE T4
Free T4: 1.11 ng/dL (ref 0.82–1.77)
TSH: 2.43 u[IU]/mL (ref 0.450–4.500)

## 2022-10-06 LAB — B12 AND FOLATE PANEL
Folate: 9.5 ng/mL (ref >3.0)
Vitamin B-12: 427 pg/mL (ref 232–1245)

## 2022-10-06 LAB — HEMOGLOBIN A1C
Est. average glucose Bld gHb Est-mCnc: 105 mg/dL
Hgb A1c MFr Bld: 5.3 % (ref 4.8–5.6)

## 2022-10-06 LAB — VITAMIN D 25 HYDROXY (VIT D DEFICIENCY, FRACTURES): Vit D, 25-Hydroxy: 14 ng/mL — ABNORMAL LOW (ref 30.0–100.0)

## 2022-11-02 ENCOUNTER — Other Ambulatory Visit (HOSPITAL_COMMUNITY): Payer: Self-pay

## 2022-11-03 ENCOUNTER — Other Ambulatory Visit (HOSPITAL_COMMUNITY): Payer: Self-pay

## 2022-12-12 ENCOUNTER — Encounter: Payer: Self-pay | Admitting: Internal Medicine

## 2022-12-12 ENCOUNTER — Other Ambulatory Visit: Payer: Self-pay

## 2022-12-12 DIAGNOSIS — F321 Major depressive disorder, single episode, moderate: Secondary | ICD-10-CM

## 2022-12-19 ENCOUNTER — Ambulatory Visit: Payer: 59 | Admitting: Adult Health

## 2023-01-11 ENCOUNTER — Other Ambulatory Visit: Payer: Self-pay | Admitting: Internal Medicine

## 2023-01-11 ENCOUNTER — Other Ambulatory Visit: Payer: Self-pay

## 2023-01-11 ENCOUNTER — Other Ambulatory Visit (HOSPITAL_COMMUNITY): Payer: Self-pay

## 2023-01-11 DIAGNOSIS — E039 Hypothyroidism, unspecified: Secondary | ICD-10-CM

## 2023-01-11 MED ORDER — LEVOTHYROXINE SODIUM 50 MCG PO TABS
50.0000 ug | ORAL_TABLET | Freq: Every day | ORAL | 2 refills | Status: DC
Start: 2023-01-11 — End: 2023-04-23
  Filled 2023-01-11: qty 30, 30d supply, fill #0
  Filled 2023-02-15: qty 30, 30d supply, fill #1
  Filled 2023-03-20: qty 30, 30d supply, fill #2

## 2023-01-18 ENCOUNTER — Other Ambulatory Visit (HOSPITAL_COMMUNITY): Payer: Self-pay

## 2023-02-08 ENCOUNTER — Other Ambulatory Visit (HOSPITAL_COMMUNITY)
Admission: RE | Admit: 2023-02-08 | Discharge: 2023-02-08 | Disposition: A | Discharge status: Home / Self Care | Payer: 59 | Source: Ambulatory Visit | Attending: Adult Health | Admitting: Adult Health

## 2023-02-08 ENCOUNTER — Encounter: Payer: Self-pay | Admitting: Adult Health

## 2023-02-08 ENCOUNTER — Ambulatory Visit (INDEPENDENT_AMBULATORY_CARE_PROVIDER_SITE_OTHER): Payer: 59 | Admitting: Adult Health

## 2023-02-08 VITALS — BP 121/73 | HR 76 | Ht 68.0 in | Wt 179.6 lb

## 2023-02-08 DIAGNOSIS — Z01419 Encounter for gynecological examination (general) (routine) without abnormal findings: Secondary | ICD-10-CM

## 2023-02-08 NOTE — Progress Notes (Signed)
Patient ID: Olivia Sellers, female   DOB: 02-12-1991, 32 y.o.   MRN: [id number] History of Present Illness: Olivia Sellers is a 32 year old white female,single, G0P0, in for well woman gyn exam and pap. She had physical and labs 10/04/22 with Dr Olivia Sellers. She is working in OR at [employer].   PCP is Dr Olivia Sellers   Current Medications, Allergies, Past Medical History, Past Surgical History, Family History and Social History were reviewed in [employer] electronic medical record.     Review of Systems: Patient denies any headaches, hearing loss, fatigue, blurred vision, shortness of breath, chest pain, abdominal pain, problems with bowel movements, urination, or intercourse. No joint pain or Sellers swings.  Breasts sore before period   Physical Exam:BP 121/73 (BP Location: Right Arm, Patient Position: Sitting, Cuff Size: Normal)   Pulse 76   Ht 5\' 8"  (1.727 m)   Wt 179 lb 9.6 oz (81.5 kg)   LMP 01/18/2023 (Approximate)   BMI 27.31 kg/m   General:  Well developed, well nourished, no acute distress Skin:  Warm and dry Neck:  Midline trachea, normal thyroid, good ROM, no lymphadenopathy Lungs; Clear to auscultation bilaterally Breast:  No dominant palpable mass, retraction, or nipple discharge Cardiovascular: Regular rate and rhythm Abdomen:  Soft, non tender, no hepatosplenomegaly Pelvic:  External genitalia is normal in appearance, no lesions.  The vagina is normal in appearance. Urethra has no lesions or masses. The cervix is smooth, pap with HR HPV genotyping performed.  Uterus is felt to be normal size, shape, and contour.  No adnexal masses or tenderness noted.Bladder is non tender, no masses felt. Rectal:Deferred Extremities/musculoskeletal:  No swelling or varicosities noted, no clubbing or cyanosis Psych:  No Sellers changes, alert and cooperative,seems happy AA is 2 Fall risk is low    02/08/2023    3:37 PM 10/04/2022    1:08 PM 09/28/2021    8:50 AM  Depression screen PHQ 2/9  Decreased Interest 3 0  0  Down, Depressed, Hopeless 3 1 0  PHQ - 2 Score 6 1 0  Altered sleeping 3 2   Tired, decreased energy 3 2   Change in appetite 1 0   Feeling bad or failure about yourself  1 0   Trouble concentrating 1 0   Moving slowly or fidgety/restless 1 0   Suicidal thoughts 0 0   PHQ-9 Score 16 5    She is on meds and going to see psychologist     02/08/2023    3:37 PM 10/04/2022    1:08 PM 09/28/2021    9:09 AM 06/18/2020    8:19 AM  GAD 7 : Generalized Anxiety Score  Nervous, Anxious, on Edge 2 1 0 3  Control/stop worrying 3 1 0 3  Worry too much - different things 3 1 0 3  Trouble relaxing 2 1 0 3  Restless 1 0 0 1  Easily annoyed or irritable 1 1 0 3  Afraid - awful might happen 1 1 0 1  Total GAD 7 Score 13 6 0 17  Anxiety Difficulty   Not difficult at all Not difficult at all      Upstream - 02/08/23 1540       Pregnancy Intention Screening   Does the patient want to become pregnant in the next year? Ok Either Way    Does the patient's partner want to become pregnant in the next year? Ok Either Way    Would the patient like to discuss contraceptive  options today? No      Contraception Wrap Up   Current Method Female Condom;Withdrawal or Other Method    End Method Female Condom;Withdrawal or Other Method            Examination chaperoned by Olivia Mood LPN   Impression and Plan:  1. Encounter for gynecological examination with Papanicolaou smear of cervix Pap sent Pap in 3 years if normal Physical in 1 year with PCP Labs with PCP - Cytology - PAP( [facility])

## 2023-02-15 LAB — CYTOLOGY - PAP
Comment: NEGATIVE
Diagnosis: NEGATIVE
High risk HPV: NEGATIVE

## 2023-03-13 ENCOUNTER — Encounter (HOSPITAL_COMMUNITY): Payer: Self-pay

## 2023-03-13 ENCOUNTER — Ambulatory Visit (INDEPENDENT_AMBULATORY_CARE_PROVIDER_SITE_OTHER): Payer: 59 | Admitting: Clinical

## 2023-03-13 DIAGNOSIS — F411 Generalized anxiety disorder: Secondary | ICD-10-CM | POA: Diagnosis not present

## 2023-03-13 DIAGNOSIS — F332 Major depressive disorder, recurrent severe without psychotic features: Secondary | ICD-10-CM | POA: Diagnosis not present
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
## 2023-03-16 ENCOUNTER — Telehealth: Payer: 59 | Admitting: Physician Assistant

## 2023-03-16 ENCOUNTER — Other Ambulatory Visit (HOSPITAL_COMMUNITY): Payer: Self-pay

## 2023-03-16 DIAGNOSIS — R3989 Other symptoms and signs involving the genitourinary system: Secondary | ICD-10-CM

## 2023-03-16 MED ORDER — CEPHALEXIN 500 MG PO CAPS
500.0000 mg | ORAL_CAPSULE | Freq: Two times a day (BID) | ORAL | 0 refills | Status: AC
  Filled 2023-03-16: qty 14, 7d supply, fill #0

## 2023-03-16 NOTE — Progress Notes (Signed)

## 2023-03-16 NOTE — Progress Notes (Signed)
I have spent 5 minutes in review of e-visit questionnaire, review and updating patient chart, medical decision making and response to patient.   William Cody Martin, PA-C    

## 2023-03-30 ENCOUNTER — Other Ambulatory Visit (HOSPITAL_COMMUNITY): Payer: Self-pay

## 2023-04-19 ENCOUNTER — Ambulatory Visit (INDEPENDENT_AMBULATORY_CARE_PROVIDER_SITE_OTHER): Payer: 59 | Admitting: Clinical

## 2023-04-19 DIAGNOSIS — F331 Major depressive disorder, recurrent, moderate: Secondary | ICD-10-CM

## 2023-04-19 DIAGNOSIS — F332 Major depressive disorder, recurrent severe without psychotic features: Secondary | ICD-10-CM

## 2023-04-19 NOTE — Progress Notes (Addendum)
Virtual Visit via Video Note  I connected with Olivia Sellers on 04/19/23 at 11:00 AM EDT by a video enabled telemedicine application and verified that I am speaking with the correct person using two identifiers.  Location: Patient: Home Provider: Office   THERAPY PROGRESS NOTE   Session Time: 11:00 AM-11:45 AM   Participation Level: Active   Behavioral Response: CasualAlertDepressed   Type of Therapy: Individual Therapy   Treatment Goals addressed: Diagnosis: Depression and Anxiety/ Coping   Interventions: CBT, DBT, Solution Focused, Strength-based, Supportive and Reframing   Summary: Olivia Sellers is a 32 y.o. female who presents with Depression and Anxiety. The OPT therapist worked with the patient for her ongoing treatment session. The OPT therapist utilized Motivational Interviewing to continue to assist in continuing therapeutic repore. The patient in the session was engaged and work in collaboration giving feedback about her triggers and symptoms over the past few weeks. The patient spoke about doing well recently with managing work related stress from her full time job at the [place] in the [place]. The patient spoke about her work recently in preparing a room for her sister to come and live with her. The patient spoke about feeling that the transition and adjustment of her sister living in the home with her and her fiance should be a smooth transition as they were already use to the sister visiting and staying a few days at a time prior to the decision of the sister moving into the home. The OPT therapist utilized Cognitive Behavioral Therapy through cognitive restructuring as well as worked with the patient on continuing to focus on her health reviewing the importance of self care in eating, sleeping, and exerciseing. The OPT therapist worked in the session with the patient on stress management as well as the In my control not in my control exercise. The patient spoke about her planned  involvement with her fiance and family..The patient spoke about actively working to balance her work life stress. The patient in this session worked with the OPT therapist in reviewing the impact of  the experience of her childhood and how this has effected the patients mental health and the healing she is now feelings/experiencing being in a place in her life where she has more freedom and feels she finally has a voice and not afraid to talk about her opinions.    Suicidal/Homicidal: Nowithout intent/plan   Therapist Response: The OPT therapist worked with the patient for the patients scheduled session. The patient was engaged in her session and gave feedback in relation to triggers, symptoms, and behavior responses over the past few weeks. The OPT therapist worked with the patient utilizing an in session Cognitive Behavioral Therapy exercise. The patient was responsive in the session and noted , " I feel like I am doing better at being able to not hold things in a talk and express myself and give my opinions".  The OPT therapist placed emphasis on the patient continuing to do self check ins and utilize positive thinking, and make sure she is taking care of her basic needs. The OPT therapist continued work with the patient on utilizing coping skills. 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 . The OPT therapist will continue treatment work with the patient in her next scheduled session.   Plan: Return again in 2/3 weeks.   Diagnosis:  Axis I: Major depressive disorder, recurrent episode, moderate with anxious distress                           Axis II: No diagnosis   Collaboration of Care: No additional collaborative care for this session   Patient/Guardian was advised Release of Information must be obtained prior to any record release in order to  collaborate their care with an outside provider. Patient/Guardian was advised if they have not already done so to contact the registration department to sign all necessary forms in order for [place] to release information regarding their care.    Consent: Patient/Guardian gives verbal consent for treatment and assignment of benefits for services provided during this visit. Patient/Guardian expressed understanding and agreed to proceed.      I discussed the assessment and treatment plan with the patient. The patient was provided an opportunity to ask questions and all were answered. The patient agreed with the plan and demonstrated an understanding of the instructions.   The patient was advised to call back or seek an in-person evaluation if the symptoms worsen or if the condition fails to improve as anticipated.   I provided 45 minutes of non-face-to-face time during this encounter.   Suzan Garibaldi, LCSW    04/19/2023

## 2023-04-23 ENCOUNTER — Other Ambulatory Visit: Payer: Self-pay | Admitting: Internal Medicine

## 2023-04-23 ENCOUNTER — Other Ambulatory Visit (HOSPITAL_COMMUNITY): Payer: Self-pay

## 2023-04-23 DIAGNOSIS — F418 Other specified anxiety disorders: Secondary | ICD-10-CM

## 2023-04-23 DIAGNOSIS — E039 Hypothyroidism, unspecified: Secondary | ICD-10-CM

## 2023-04-23 MED ORDER — LEVOTHYROXINE SODIUM 50 MCG PO TABS
50.0000 ug | ORAL_TABLET | Freq: Every day | ORAL | 2 refills | Status: DC
Start: 2023-04-23 — End: 2023-08-06
  Filled 2023-04-23: qty 30, 30d supply, fill #0
  Filled 2023-05-31: qty 30, 30d supply, fill #1
  Filled 2023-07-09: qty 30, 30d supply, fill #2

## 2023-04-23 MED ORDER — ESCITALOPRAM OXALATE 20 MG PO TABS
20.0000 mg | ORAL_TABLET | Freq: Every day | ORAL | 1 refills | Status: DC
Start: 2023-04-23 — End: 2023-10-18
  Filled 2023-04-23: qty 90, 90d supply, fill #0
  Filled 2023-07-26: qty 90, 90d supply, fill #1

## 2023-05-21 ENCOUNTER — Ambulatory Visit (INDEPENDENT_AMBULATORY_CARE_PROVIDER_SITE_OTHER): Payer: 59 | Admitting: Clinical

## 2023-05-21 DIAGNOSIS — F331 Major depressive disorder, recurrent, moderate: Secondary | ICD-10-CM | POA: Diagnosis not present

## 2023-05-21 DIAGNOSIS — F332 Major depressive disorder, recurrent severe without psychotic features: Secondary | ICD-10-CM

## 2023-05-21 NOTE — Progress Notes (Signed)
Virtual Visit via Video Note   I connected with Olivia Sellers on 05/21/23 at 11:00 AM EDT by a video enabled telemedicine application and verified that I am speaking with the correct person using two identifiers.   Location: Patient: Home Provider: Office   THERAPY PROGRESS NOTE   Session Time: 11:00 AM-11:45 AM   Participation Level: Active   Behavioral Response: CasualAlertDepressed   Type of Therapy: Individual Therapy   Treatment Goals addressed: Diagnosis: Depression and Anxiety/ Coping   Interventions: CBT, DBT, Solution Focused, Strength-based, Supportive and Reframing   Summary: Olivia Sellers is a 32 y.o. female who presents with Depression and Anxiety. The OPT therapist worked with the patient for her ongoing treatment session. The OPT therapist utilized Motivational Interviewing to continue to assist in continuing therapeutic repore. The patient in the session was engaged and work in collaboration giving feedback about her triggers and symptoms over the past few weeks. The patient spoke about doing well recently with managing work related stress from [place] and she was entrusted to train a new employee which allowed her to view her job from a different perspective which she felt was benifical. The patient spoke about her  her sister to coming and live with her, and noted that this has been overall a good situation, however, her sister will be moving out of the home and in with a boyfriend and so the stay will be short. The OPT therapist utilized Cognitive Behavioral Therapy through cognitive restructuring as well as worked with the patient on continuing to focus on her health reviewing the importance of self care in eating, sleeping, and exerciseing. The OPT therapist worked in the session with the patient on stress management as well as the In my control not in my control exercise. The patient spoke about her planned involvement with her fiance  and family..The patient spoke about actively working to balance her work life stress. The patient in this session worked with the OPT therapist in reviewing the impact of  the experience of her childhood and how this has effected the patients mental health and the healing she is now feelings/experiencing being in a place in her life where she has more freedom and enjoys controlling her own routine. The patient spoke about looking forward to upcoming Halloween and Thanksgiving holidays as well as noted close to Thanksgiving it will also be her Anniversary with her partner.    Suicidal/Homicidal: Nowithout intent/plan   Therapist Response: The OPT therapist worked with the patient for the patients scheduled session. The patient was engaged in her session and gave feedback in relation to triggers, symptoms, and behavior responses over the past few weeks. The OPT therapist worked with the patient utilizing an in session Cognitive Behavioral Therapy exercise. The patient was responsive in the session and noted , " I feel things have been going pretty well , I have enjoyed my sister living here, but her dogs sometimes get into things in the kitchen, however, she is going to be moving out as she and her boyfriend found a place".  The OPT therapist placed emphasis on the patient continuing to do self check ins and utilize positive thinking, and make sure she is taking care of her basic needs. The OPT therapist continued work with the patient on utilizing coping skills. The patient spoke about her passion around drawing and has been utilizing this and finishing a short story she's writing .The OPT therapist placed emphasis  in continuing to use coping skills to help balance her MH. The OPT therapist will continue treatment work with the patient in her next scheduled session.   Plan: Return again in 2/3 weeks.   Diagnosis:      Axis I: Major depressive disorder, recurrent episode, moderate with anxious distress                            Axis II: No diagnosis   Collaboration of Care: No additional collaborative care for this session   Patient/Guardian was advised Release of Information must be obtained prior to any record release in order to collaborate their care with an outside provider. Patient/Guardian was advised if they have not already done so to contact the registration department to sign all necessary forms in order for [place] to release information regarding their care.    Consent: Patient/Guardian gives verbal consent for treatment and assignment of benefits for services provided during this visit. Patient/Guardian expressed understanding and agreed to proceed.      I discussed the assessment and treatment plan with the patient. The patient was provided an opportunity to ask questions and all were answered. The patient agreed with the plan and demonstrated an understanding of the instructions.   The patient was advised to call back or seek an in-person evaluation if the symptoms worsen or if the condition fails to improve as anticipated.   I provided 45 minutes of non-face-to-face time during this encounter.   Suzan Garibaldi, LCSW    05/21/2023

## 2023-07-02 ENCOUNTER — Ambulatory Visit: Payer: 59

## 2023-07-02 ENCOUNTER — Other Ambulatory Visit (HOSPITAL_COMMUNITY): Payer: Self-pay

## 2023-07-02 ENCOUNTER — Encounter: Payer: Self-pay | Admitting: Internal Medicine

## 2023-07-02 ENCOUNTER — Ambulatory Visit
Admission: EM | Admit: 2023-07-02 | Discharge: 2023-07-02 | Disposition: A | Discharge status: Home / Self Care | Payer: 59 | Attending: Emergency Medicine | Admitting: Emergency Medicine

## 2023-07-02 DIAGNOSIS — M542 Cervicalgia: Secondary | ICD-10-CM | POA: Diagnosis not present

## 2023-07-02 DIAGNOSIS — G4486 Cervicogenic headache: Secondary | ICD-10-CM

## 2023-07-02 DIAGNOSIS — R519 Headache, unspecified: Secondary | ICD-10-CM | POA: Diagnosis not present

## 2023-07-02 DIAGNOSIS — M62838 Other muscle spasm: Secondary | ICD-10-CM

## 2023-07-02 MED ORDER — IBUPROFEN 600 MG PO TABS
600.0000 mg | ORAL_TABLET | Freq: Three times a day (TID) | ORAL | 0 refills | Status: AC | PRN
  Filled 2023-07-02: qty 30, 10d supply, fill #0

## 2023-07-02 MED ORDER — BACLOFEN 10 MG PO TABS
10.0000 mg | ORAL_TABLET | Freq: Three times a day (TID) | ORAL | 0 refills | Status: AC
  Filled 2023-07-02 (×2): qty 21, 7d supply, fill #0

## 2023-07-02 MED ORDER — IBUPROFEN 800 MG PO TABS
800.0000 mg | ORAL_TABLET | Freq: Once | ORAL | Status: AC
  Administered 2023-07-02: 800 mg via ORAL

## 2023-07-02 NOTE — Discharge Instructions (Signed)
The x-ray of your neck does not reveal any acute bony injury.  What it does reveal is abnormal straightening of your cervical spine due to tightness in the muscles around your neck and in your upper trapezius muscles.    I recommend that you begin taking ibuprofen 600 mg every 6-8 hours as needed for head and neck pain and begin a muscle relaxer at least once daily but up to 3 times daily as needed for tension in the muscles around your neck and in your upper back.    You are also welcome to apply ice to the base of your neck and back of your head as needed for headache as well.  Please follow-up with your primary care provider regarding these findings and these recommendations.  Thank you for visiting [facility] today.  We appreciate the opportunity to participate in your care.

## 2023-07-02 NOTE — ED Provider Notes (Signed)
[name]    CSN: [id number] Arrival date & time: 07/02/23  1345    HISTORY   Chief Complaint  Patient presents with   Headache   HPI Olivia Sellers is a pleasant, 32 y.o. female who presents to urgent care today. Patient states for the past 2 weeks she has been having constant headache, intermittent neck pain and a sharp, jabbing pressure behind her right eye which she states has been getting worse.  Patient states she has been taking 1 tablet of Tylenol which provides a small amount of relief and has also tried taking 1 tablet of ibuprofen which has provided no relief.  Patient states that 6 months ago she tripped and fell landing on her right shoulder and hitting the right side of her head.  Patient states she did not lose consciousness or seek medical attention when this happened.  Patient denies vision changes, dizziness, altered mental status, difficulty balancing.  The history is provided by the patient.   Past Medical History:  Diagnosis Date   Anemia    Anxiety    Cholelithiasis    Depression    Encounter for gynecological examination with Papanicolaou smear of cervix 08/20/2019   Encounter for initial prescription of contraceptive pills 02/19/2020   Encounter for Nexplanon removal 02/19/2020   General counseling and advice for contraceptive management 08/20/2019   Hyperlipidemia 2021   Insomnia    Nexplanon insertion 09/09/2019   Pilonidal cyst    hx of   Thyroid disease 2021   Patient Active Problem List   Diagnosis Date Noted   Encounter for well adult exam with abnormal findings 09/28/2021   Need for diphtheria-tetanus-pertussis (Tdap) vaccine 09/28/2021   Suicide attempt by hanging (HCC) 02/03/2021   Nausea 08/30/2020   Missed period 08/30/2020   Abdominal cramping 08/30/2020   Pregnancy examination or test, negative result 08/30/2020   Dizzy spells 08/30/2020   Acquired hypothyroidism 08/26/2020   Orthostatic hypotension 08/26/2020   Annual  visit for general adult medical examination with abnormal findings 06/18/2020   Other fatigue 06/18/2020   Missed menses 06/18/2020   Depression, major, single episode, moderate (HCC) 09/23/2019   Situational anxiety 06/18/2019   Shifting sleep-work schedule 06/18/2019   Past Surgical History:  Procedure Laterality Date   CHOLECYSTECTOMY N/A 08/22/2019   Procedure: LAPAROSCOPIC CHOLECYSTECTOMY;  Surgeon: Axel Filler, MD;  Location: [facility];  Service: General;  Laterality: N/A;   SURGERY OF LIP     suture to lip at age 35   wisdom teeth removed     OB History     Gravida  0   Para  0   Term  0   Preterm  0   AB  0   Living  0      SAB  0   IAB  0   Ectopic  0   Multiple  0   Live Births  0          Home Medications    Prior to Admission medications   Medication Sig Start Date End Date Taking? Authorizing Provider  Cholecalciferol (VITAMIN D3) 25 MCG (1000 UT) CAPS Take 1 capsule (1,000 Units total) by mouth daily. 09/29/21   Paseda, Baird Kay, FNP  escitalopram (LEXAPRO) 20 MG tablet Take 1 tablet (20 mg total) by mouth daily. 04/23/23   Billie Lade, MD  levothyroxine (SYNTHROID) 50 MCG tablet Take 1 tablet (50 mcg total) by mouth daily before breakfast. 04/23/23   Christel Mormon  E, MD  Vit B6-Vit B12-Omega 3 Acids (VITAMIN B PLUS+ PO) Take by mouth.    [provider]    Family History Family History  Problem Relation Age of Onset   Panic disorder Mother    Drug abuse Mother    Alcohol abuse Mother    Depression Mother    COPD Mother    Bipolar disorder Father    Alcohol abuse Father    Heart failure Father    Anemia Sister    Heart disease Paternal Grandmother    Pancreatic cancer Paternal Grandfather    Colon cancer Neg Hx    Breast cancer Neg Hx    Cervical cancer Neg Hx    Social History Social History   Tobacco Use   Smoking status: Never   Smokeless tobacco: Never  Vaping Use   Vaping status:  Never Used  Substance Use Topics   Alcohol use: Yes    Alcohol/week: 1.0 standard drink of alcohol    Types: 1 Glasses of wine per week    Comment: socially    Drug use: Never   Allergies   Patient has no known allergies.  Review of Systems Review of Systems Pertinent findings revealed after performing a 14 point review of systems has been noted in the history of present illness.  Physical Exam Vital Signs BP 126/85 (BP Location: Right Arm)   Pulse 75   Temp 97.8 F (36.6 C) (Oral)   Resp 18   LMP 06/30/2023 (Exact Date)   SpO2 98%   No data found.  Physical Exam Vitals and nursing note reviewed.  Constitutional:      General: She is not in acute distress.    Appearance: Normal appearance.  HENT:     Head: Normocephalic and atraumatic.  Eyes:     General: Lids are normal.     Extraocular Movements: Extraocular movements intact.     Conjunctiva/sclera: Conjunctivae normal.     Pupils: Pupils are equal, round, and reactive to light.  Cardiovascular:     Rate and Rhythm: Normal rate and regular rhythm.  Pulmonary:     Effort: Pulmonary effort is normal.     Breath sounds: Normal breath sounds.  Musculoskeletal:        General: Normal range of motion.     Cervical back: Normal range of motion and neck supple.  Skin:    General: Skin is warm and dry.  Neurological:     General: No focal deficit present.     Mental Status: She is alert and oriented to person, place, and time. Mental status is at baseline.     Cranial Nerves: Cranial nerves 2-12 are intact.     Sensory: Sensation is intact.     Motor: Motor function is intact.     Coordination: Coordination is intact.     Deep Tendon Reflexes: Reflexes are normal and symmetric.  Psychiatric:        Mood and Affect: Mood normal.        Behavior: Behavior normal.        Thought Content: Thought content normal.        Judgment: Judgment normal.     Visual Acuity Right Eye Distance:   Left Eye Distance:    Bilateral Distance:    Right Eye Near:   Left Eye Near:    Bilateral Near:     UC Couse / Diagnostics / Procedures:     Radiology No results found.  Procedures Procedures (including  critical care time) EKG  Pending results:  Labs Reviewed - No data to display  Medications Ordered in UC: Medications  ibuprofen (ADVIL) tablet 800 mg (800 mg Oral Given 07/02/23 1422)    UC Diagnoses / Final Clinical Impressions(s)   I have reviewed the triage vital signs and the nursing notes.  Pertinent labs & imaging results that were available during my care of the patient were reviewed by me and considered in my medical decision making (see chart for details).    Final diagnoses:  Cervical paraspinous muscle spasm  Cervicogenic headache   Patient advised that x-ray was concerning for abnormal lordosis of cervical spine which is likely related to cervical paraspinous muscle spasm.  Will notify patient of x-ray results once received.  Patient reassured that it is unlikely that current symptoms are related to her falling 6 months ago.  Patient provided with ibuprofen 800 mg during her visit and advised to continue 600 mg dose every 6-8 hours as needed for headache and baclofen 10 mg 3 times daily as needed for muscle spasm.  Recommend follow-up with PCP if needed.  Please see discharge instructions below for details of plan of care as provided to patient. ED Prescriptions     Medication Sig Dispense Auth. Provider   ibuprofen (ADVIL) 600 MG tablet Take 1 tablet (600 mg total) by mouth every 8 (eight) hours as needed for up to 30 doses for fever, headache, mild pain (pain score 1-3) or moderate pain (pain score 4-6) (Inflammation) or for inflammation of upper airways and/or pain. 30 tablet Theadora Rama Scales, PA-C   baclofen (LIORESAL) 10 MG tablet Take 1 tablet (10 mg total) by mouth 3 (three) times daily for 7 days. 21 tablet Theadora Rama Scales, PA-C      PDMP not reviewed this  encounter.  Pending results:  Labs Reviewed - No data to display  Discharge Instructions:   Discharge Instructions      The x-ray of your neck does not reveal any acute bony injury.  What it does reveal is abnormal straightening of your cervical spine due to tightness in the muscles around your neck and in your upper trapezius muscles.    I recommend that you begin taking ibuprofen 600 mg every 6-8 hours as needed for head and neck pain and begin a muscle relaxer at least once daily but up to 3 times daily as needed for tension in the muscles around your neck and in your upper back.    You are also welcome to apply ice to the base of your neck and back of your head as needed for headache as well.  Please follow-up with your primary care provider regarding these findings and these recommendations.  Thank you for visiting [facility] today.  We appreciate the opportunity to participate in your care.      Disposition Upon Discharge:  Condition: stable for discharge home  Patient presented with an acute illness with associated systemic symptoms and significant discomfort requiring urgent management. In my opinion, this is a condition that a prudent lay person (someone who possesses an average knowledge of health and medicine) may potentially expect to result in complications if not addressed urgently such as respiratory distress, impairment of bodily function or dysfunction of bodily organs.   Routine symptom specific, illness specific and/or disease specific instructions were discussed with the patient and/or caregiver at length.   As such, the patient has been evaluated and assessed, work-up was performed and treatment  was provided in alignment with urgent care protocols and evidence based medicine.  Patient/parent/caregiver has been advised that the patient may require follow up for further testing and treatment if the symptoms continue in spite of treatment, as clinically  indicated and appropriate.  Patient/parent/caregiver has been advised to return to the [facility] or PCP if no better; to PCP or the Emergency Department if new signs and symptoms develop, or if the current signs or symptoms continue to change or worsen for further workup, evaluation and treatment as clinically indicated and appropriate  The patient will follow up with their current PCP if and as advised. If the patient does not currently have a PCP we will assist them in obtaining one.   The patient may need specialty follow up if the symptoms continue, in spite of conservative treatment and management, for further workup, evaluation, consultation and treatment as clinically indicated and appropriate.  Patient/parent/caregiver verbalized understanding and agreement of plan as discussed.  All questions were addressed during visit.  Please see discharge instructions below for further details of plan.  This office note has been dictated using Teaching laboratory technician.  Unfortunately, this method of dictation can sometimes lead to typographical or grammatical errors.  I apologize for your inconvenience in advance if this occurs.  Please do not hesitate to reach out to me if clarification is needed.      Theadora Rama Scales, [place] 07/02/23 [phone]

## 2023-07-02 NOTE — ED Triage Notes (Signed)
Pt states 6 months ago she tripped over her dog leash and fell landing on rt shoulder and hitting rt side of head, denies loc or getting tx. States has had headaches and neck pain intermittent. C/o constant headache, neck pain and pressure/sharp/jabbing feeling behind rt eye x2wks and getting worse. States taking tylenol with little relief.

## 2023-07-09 ENCOUNTER — Other Ambulatory Visit (HOSPITAL_COMMUNITY): Payer: Self-pay

## 2023-08-06 ENCOUNTER — Other Ambulatory Visit (HOSPITAL_COMMUNITY): Payer: Self-pay

## 2023-08-06 ENCOUNTER — Other Ambulatory Visit: Payer: Self-pay | Admitting: Internal Medicine

## 2023-08-06 DIAGNOSIS — E039 Hypothyroidism, unspecified: Secondary | ICD-10-CM

## 2023-08-06 MED ORDER — LEVOTHYROXINE SODIUM 50 MCG PO TABS
50.0000 ug | ORAL_TABLET | Freq: Every day | ORAL | 2 refills | Status: DC
  Filled 2023-08-06: qty 30, 30d supply, fill #0
  Filled 2023-09-03: qty 30, 30d supply, fill #1
  Filled 2023-10-05: qty 30, 30d supply, fill #2

## 2023-08-13 ENCOUNTER — Ambulatory Visit: Payer: Self-pay | Admitting: Internal Medicine

## 2023-08-20 ENCOUNTER — Encounter: Payer: Self-pay | Admitting: Internal Medicine

## 2023-09-03 ENCOUNTER — Other Ambulatory Visit: Payer: Self-pay

## 2023-10-18 ENCOUNTER — Other Ambulatory Visit (HOSPITAL_COMMUNITY): Payer: Self-pay

## 2023-10-18 ENCOUNTER — Other Ambulatory Visit: Payer: Self-pay | Admitting: Internal Medicine

## 2023-10-18 DIAGNOSIS — F418 Other specified anxiety disorders: Secondary | ICD-10-CM

## 2023-10-18 MED ORDER — ESCITALOPRAM OXALATE 20 MG PO TABS
20.0000 mg | ORAL_TABLET | Freq: Every day | ORAL | 1 refills | Status: DC
  Filled 2023-10-18: qty 90, 90d supply, fill #0
  Filled 2024-02-06: qty 90, 90d supply, fill #1

## 2023-11-07 ENCOUNTER — Other Ambulatory Visit (HOSPITAL_COMMUNITY): Payer: Self-pay

## 2023-11-07 ENCOUNTER — Other Ambulatory Visit: Payer: Self-pay | Admitting: Internal Medicine

## 2023-11-07 DIAGNOSIS — E039 Hypothyroidism, unspecified: Secondary | ICD-10-CM

## 2023-11-07 MED ORDER — LEVOTHYROXINE SODIUM 50 MCG PO TABS
50.0000 ug | ORAL_TABLET | Freq: Every day | ORAL | 2 refills | Status: DC
  Filled 2023-11-07: qty 30, 30d supply, fill #0
  Filled 2023-12-03: qty 30, 30d supply, fill #1
  Filled 2024-01-07: qty 30, 30d supply, fill #2

## 2023-12-03 ENCOUNTER — Other Ambulatory Visit (HOSPITAL_COMMUNITY): Payer: Self-pay

## 2023-12-13 DIAGNOSIS — H5213 Myopia, bilateral: Secondary | ICD-10-CM | POA: Diagnosis not present

## 2023-12-13 DIAGNOSIS — H52223 Regular astigmatism, bilateral: Secondary | ICD-10-CM | POA: Diagnosis not present

## 2024-02-06 ENCOUNTER — Other Ambulatory Visit: Payer: Self-pay

## 2024-02-06 ENCOUNTER — Encounter (HOSPITAL_COMMUNITY): Payer: Self-pay

## 2024-02-06 ENCOUNTER — Other Ambulatory Visit: Payer: Self-pay | Admitting: Internal Medicine

## 2024-02-06 ENCOUNTER — Other Ambulatory Visit (HOSPITAL_COMMUNITY): Payer: Self-pay

## 2024-02-06 DIAGNOSIS — E039 Hypothyroidism, unspecified: Secondary | ICD-10-CM

## 2024-02-11 ENCOUNTER — Encounter: Payer: Self-pay | Admitting: Internal Medicine

## 2024-02-11 ENCOUNTER — Other Ambulatory Visit (HOSPITAL_COMMUNITY): Payer: Self-pay

## 2024-02-11 ENCOUNTER — Ambulatory Visit: Payer: Self-pay | Admitting: Internal Medicine

## 2024-02-11 VITALS — BP 114/64 | HR 69 | Ht 67.5 in | Wt 169.8 lb

## 2024-02-11 DIAGNOSIS — F418 Other specified anxiety disorders: Secondary | ICD-10-CM

## 2024-02-11 DIAGNOSIS — Z0001 Encounter for general adult medical examination with abnormal findings: Secondary | ICD-10-CM

## 2024-02-11 DIAGNOSIS — E039 Hypothyroidism, unspecified: Secondary | ICD-10-CM | POA: Diagnosis not present

## 2024-02-11 DIAGNOSIS — E559 Vitamin D deficiency, unspecified: Secondary | ICD-10-CM

## 2024-02-11 DIAGNOSIS — E785 Hyperlipidemia, unspecified: Secondary | ICD-10-CM | POA: Diagnosis not present

## 2024-02-11 DIAGNOSIS — R739 Hyperglycemia, unspecified: Secondary | ICD-10-CM | POA: Diagnosis not present

## 2024-02-11 MED ORDER — ESCITALOPRAM OXALATE 20 MG PO TABS
20.0000 mg | ORAL_TABLET | Freq: Every day | ORAL | 1 refills | Status: DC
  Filled 2024-02-13 – 2024-05-12 (×2): qty 90, 90d supply, fill #0

## 2024-02-11 MED ORDER — LEVOTHYROXINE SODIUM 50 MCG PO TABS
50.0000 ug | ORAL_TABLET | Freq: Every day | ORAL | 1 refills | Status: DC
  Filled 2024-02-11: qty 90, 90d supply, fill #0
  Filled 2024-05-12: qty 90, 90d supply, fill #1

## 2024-02-11 NOTE — Patient Instructions (Signed)
 Please continue to take medications as prescribed.  Please continue to follow heart healthy diet and perform moderate exercise/walking at least 150 mins/week.

## 2024-02-11 NOTE — Progress Notes (Unsigned)
 Established Patient Office Visit  Subjective:  Patient ID: Olivia Sellers, female    DOB: 26-Aug-1990  Age: 33 y.o. MRN: [id number]  CC:  Chief Complaint  Patient presents with   Medical Management of Chronic Issues    Pt needs refills.     HPI Olivia Sellers is a 33 y.o. female with past medical history of hypothyroidism and GAD who presents for f/u of her chronic medical conditions.  She has been doing well since increase in Levothyroxine  dose. She denies any constipation/diarrhea, recent appetite or weight change. She has been feeling more energetic now.   She has been doing well with Lexapro . Denies anhedonia, anxiety spells, SI or HI.  Past Medical History:  Diagnosis Date   Anemia    Anxiety    Cholelithiasis    Depression    Encounter for gynecological examination with Papanicolaou smear of cervix 08/20/2019   Encounter for initial prescription of contraceptive pills 02/19/2020   Encounter for Nexplanon  removal 02/19/2020   General counseling and advice for contraceptive management 08/20/2019   Hyperlipidemia 2021   Insomnia    Nexplanon  insertion 09/09/2019   Pilonidal cyst    hx of   Thyroid  disease 2021    Past Surgical History:  Procedure Laterality Date   CHOLECYSTECTOMY N/A 08/22/2019   Procedure: LAPAROSCOPIC CHOLECYSTECTOMY;  Surgeon: Rubin Calamity, MD;  Location: [facility];  Service: General;  Laterality: N/A;   SURGERY OF LIP     suture to lip at age 11   wisdom teeth removed      Family History  Problem Relation Age of Onset   Panic disorder Mother    Drug abuse Mother    Alcohol abuse Mother    Depression Mother    COPD Mother    Bipolar disorder Father    Alcohol abuse Father    Heart failure Father    Anemia Sister    Heart disease Paternal Grandmother    Pancreatic cancer Paternal Grandfather    Colon cancer Neg Hx    Breast cancer Neg Hx    Cervical cancer Neg Hx     Social History   Socioeconomic History    Marital status: Single    Spouse name: Not on file   Number of children: Not on file   Years of education: Not on file   Highest education level: Associate degree: academic program  Occupational History   Occupation: RN  Tobacco Use   Smoking status: Never   Smokeless tobacco: Never  Vaping Use   Vaping status: Never Used  Substance and Sexual Activity   Alcohol use: Yes    Alcohol/week: 1.0 standard drink of alcohol    Types: 1 Glasses of wine per week    Comment: socially    Drug use: Never   Sexual activity: Yes    Birth control/protection: Condom  Other Topics Concern   Not on file  Social History Narrative   Lives with her boyfriend         Enjoys drawing      Diet:  eating fast food a lot, eating more veggies and fruits now, beans-for vegetarian like       Water: takes 2 bottles of water daily       Wears seat belt   Wears sun protection   Smoke detectors    Does not use phone while driving       Social Drivers of Health   Financial Resource Strain: Low Risk  (02/08/2023)  Overall Financial Resource Strain (CARDIA)    Difficulty of Paying Living Expenses: Not very hard  Food Insecurity: No Food Insecurity (02/08/2023)   Hunger Vital Sign    Worried About Running Out of Food in the Last Year: Never true    Ran Out of Food in the Last Year: Never true  Transportation Needs: No Transportation Needs (02/08/2023)   PRAPARE - Administrator, Civil Service (Medical): No    Lack of Transportation (Non-Medical): No  Physical Activity: Sufficiently Active (02/08/2023)   Exercise Vital Sign    Days of Exercise per Week: 5 days    Minutes of Exercise per Session: 30 min  Stress: Stress Concern Present (02/08/2023)   [employer] of Occupational Health - Occupational Stress Questionnaire    Feeling of Stress : Rather much  Social Connections: Moderately Isolated (02/08/2023)   Social Connection and Isolation Panel    Frequency of Communication with  Friends and Family: Three times a week    Frequency of Social Gatherings with Friends and Family: Once a week    Attends Religious Services: Never    Database administrator or Organizations: No    Attends Banker Meetings: Never    Marital Status: Living with partner  Intimate Partner Violence: Not At Risk (02/08/2023)   Humiliation, Afraid, Rape, and Kick questionnaire    Fear of Current or Ex-Partner: No    Emotionally Abused: No    Physically Abused: No    Sexually Abused: No    Outpatient Medications Prior to Visit  Medication Sig Dispense Refill   Cholecalciferol  (VITAMIN D3) 25 MCG (1000 UT) CAPS Take 1 capsule (1,000 Units total) by mouth daily. 60 capsule 3   ibuprofen  (ADVIL ) 600 MG tablet Take 1 tablet (600 mg total) by mouth every 8 (eight) hours as needed for up to 30 doses for fever, headache, mild pain (pain score 1-3) or moderate pain (pain score 4-6) (Inflammation) or for inflammation of upper airways and/or pain. 30 tablet 0   Vit B6-Vit B12-Omega 3 Acids (VITAMIN B PLUS+ PO) Take by mouth.     escitalopram  (LEXAPRO ) 20 MG tablet Take 1 tablet (20 mg total) by mouth daily. 90 tablet 1   levothyroxine  (SYNTHROID ) 50 MCG tablet Take 1 tablet (50 mcg total) by mouth daily before breakfast. 30 tablet 2   No facility-administered medications prior to visit.    No Known Allergies  ROS Review of Systems  Constitutional:  Negative for chills and fever.  HENT:  Negative for congestion, sinus pressure, sinus pain and sore throat.   Eyes:  Negative for pain and discharge.  Respiratory:  Negative for cough and shortness of breath.   Cardiovascular:  Negative for chest pain and palpitations.  Gastrointestinal:  Negative for abdominal pain, diarrhea, nausea and vomiting.  Endocrine: Negative for polydipsia and polyuria.  Genitourinary:  Negative for dysuria and hematuria.  Musculoskeletal:  Negative for neck pain and neck stiffness.  Skin:  Negative for rash.   Neurological:  Negative for dizziness and weakness.  Psychiatric/Behavioral:  Negative for agitation and behavioral problems.       Objective:    Physical Exam Vitals reviewed.  Constitutional:      General: She is not in acute distress.    Appearance: She is not diaphoretic.  HENT:     Head: Normocephalic and atraumatic.     Nose: Nose normal. No congestion.     Mouth/Throat:     Mouth: Mucous membranes are  moist.     Pharynx: No posterior oropharyngeal erythema.  Eyes:     General: No scleral icterus.    Extraocular Movements: Extraocular movements intact.  Cardiovascular:     Rate and Rhythm: Normal rate and regular rhythm.     Heart sounds: Normal heart sounds. No murmur heard. Pulmonary:     Breath sounds: Normal breath sounds. No wheezing or rales.  Abdominal:     Palpations: Abdomen is soft.     Tenderness: There is no abdominal tenderness.  Musculoskeletal:     Cervical back: Neck supple. No tenderness.     Right lower leg: No edema.     Left lower leg: No edema.  Skin:    General: Skin is warm.     Findings: No rash.  Neurological:     General: No focal deficit present.     Mental Status: She is alert and oriented to person, place, and time.     Sensory: No sensory deficit.     Motor: No weakness.  Psychiatric:        Mood and Affect: Mood normal.        Behavior: Behavior normal.     BP 114/64   Pulse 69   Ht 5' 7.5 (1.715 m)   Wt 169 lb 12.8 oz (77 kg)   SpO2 98%   BMI 26.20 kg/m  Wt Readings from Last 3 Encounters:  02/11/24 169 lb 12.8 oz (77 kg)  02/08/23 179 lb 9.6 oz (81.5 kg)  10/04/22 169 lb 12.8 oz (77 kg)    Lab Results  Component Value Date   TSH 5.340 (H) 02/11/2024   Lab Results  Component Value Date   WBC 7.2 02/11/2024   HGB 13.5 02/11/2024   HCT 41.5 02/11/2024   MCV 90 02/11/2024   PLT 396 02/11/2024   Lab Results  Component Value Date   NA 139 02/11/2024   K 4.5 02/11/2024   CO2 24 02/11/2024   GLUCOSE 92  02/11/2024   BUN 9 02/11/2024   CREATININE 0.61 02/11/2024   BILITOT 0.3 02/11/2024   ALKPHOS 80 02/11/2024   AST 16 02/11/2024   ALT 37 (H) 02/11/2024   PROT 6.6 02/11/2024   ALBUMIN 4.3 02/11/2024   CALCIUM 9.3 02/11/2024   ANIONGAP 11 02/03/2021   EGFR 122 02/11/2024   Lab Results  Component Value Date   CHOL 132 02/11/2024   Lab Results  Component Value Date   HDL 51 02/11/2024   Lab Results  Component Value Date   LDLCALC 66 02/11/2024   Lab Results  Component Value Date   TRIG 74 02/11/2024   Lab Results  Component Value Date   CHOLHDL 2.6 02/11/2024   Lab Results  Component Value Date   HGBA1C 5.0 02/11/2024      Assessment & Plan:   Problem List Items Addressed This Visit       Endocrine   Acquired hypothyroidism   On Levothyroxine  50 mcg QD Check TSH and free T4      Relevant Medications   levothyroxine  (SYNTHROID ) 50 MCG tablet   Other Relevant Orders   CMP14+EGFR (Completed)   CBC with Differential/Platelet (Completed)   TSH + free T4 (Completed)     Other   Situational anxiety   Well-controlled with Lexapro  20 mg QD, refilled      Relevant Medications   escitalopram  (LEXAPRO ) 20 MG tablet   Other Relevant Orders   CMP14+EGFR (Completed)   CBC with Differential/Platelet (Completed)  Encounter for well adult exam with abnormal findings - Primary   Physical exam as documented. Fasting blood tests today.      Other Visit Diagnoses       Vitamin D  deficiency       Relevant Orders   VITAMIN D  25 Hydroxy (Vit-D Deficiency, Fractures) (Completed)     Hyperlipidemia, unspecified hyperlipidemia type       Relevant Orders   Lipid panel (Completed)     Hyperglycemia       Relevant Orders   Hemoglobin A1c (Completed)       Meds ordered this encounter  Medications   escitalopram  (LEXAPRO ) 20 MG tablet    Sig: Take 1 tablet (20 mg total) by mouth daily.    Dispense:  90 tablet    Refill:  1   levothyroxine  (SYNTHROID ) 50 MCG  tablet    Sig: Take 1 tablet (50 mcg total) by mouth daily before breakfast.    Dispense:  90 tablet    Refill:  1    Follow-up: Return in about 6 months (around 08/13/2024) for Hypothyroidism.    Suzzane MARLA Blanch, MD

## 2024-02-12 ENCOUNTER — Other Ambulatory Visit (HOSPITAL_COMMUNITY): Payer: Self-pay

## 2024-02-12 ENCOUNTER — Ambulatory Visit: Payer: Self-pay | Admitting: Internal Medicine

## 2024-02-12 LAB — CMP14+EGFR
ALT: 37 IU/L — ABNORMAL HIGH (ref 0–32)
AST: 16 IU/L (ref 0–40)
Albumin: 4.3 g/dL (ref 3.9–4.9)
Alkaline Phosphatase: 80 IU/L (ref 44–121)
BUN/Creatinine Ratio: 15 (ref 9–23)
BUN: 9 mg/dL (ref 6–20)
Bilirubin Total: 0.3 mg/dL (ref 0.0–1.2)
CO2: 24 mmol/L (ref 20–29)
Calcium: 9.3 mg/dL (ref 8.7–10.2)
Chloride: 103 mmol/L (ref 96–106)
Creatinine, Ser: 0.61 mg/dL (ref 0.57–1.00)
Globulin, Total: 2.3 g/dL (ref 1.5–4.5)
Glucose: 92 mg/dL (ref 70–99)
Potassium: 4.5 mmol/L (ref 3.5–5.2)
Sodium: 139 mmol/L (ref 134–144)
Total Protein: 6.6 g/dL (ref 6.0–8.5)
eGFR: 122 mL/min/1.73 (ref >59)

## 2024-02-12 LAB — HEMOGLOBIN A1C
Est. average glucose Bld gHb Est-mCnc: 97 mg/dL
Hgb A1c MFr Bld: 5 % (ref 4.8–5.6)

## 2024-02-12 LAB — CBC WITH DIFFERENTIAL/PLATELET
Basophils Absolute: 0 x10E3/uL (ref 0.0–0.2)
Basos: 1 %
EOS (ABSOLUTE): 0.1 x10E3/uL (ref 0.0–0.4)
Eos: 1 %
Hematocrit: 41.5 % (ref 34.0–46.6)
Hemoglobin: 13.5 g/dL (ref 11.1–15.9)
Immature Grans (Abs): 0 x10E3/uL (ref 0.0–0.1)
Immature Granulocytes: 0 %
Lymphocytes Absolute: 2.2 x10E3/uL (ref 0.7–3.1)
Lymphs: 30 %
MCH: 29.4 pg (ref 26.6–33.0)
MCHC: 32.5 g/dL (ref 31.5–35.7)
MCV: 90 fL (ref 79–97)
Monocytes Absolute: 0.4 x10E3/uL (ref 0.1–0.9)
Monocytes: 6 %
Neutrophils Absolute: 4.5 x10E3/uL (ref 1.4–7.0)
Neutrophils: 62 %
Platelets: 396 x10E3/uL (ref 150–450)
RBC: 4.59 x10E6/uL (ref 3.77–5.28)
RDW: 12.4 % (ref 11.7–15.4)
WBC: 7.2 x10E3/uL (ref 3.4–10.8)

## 2024-02-12 LAB — TSH+FREE T4
Free T4: 1.06 ng/dL (ref 0.82–1.77)
TSH: 5.34 u[IU]/mL — ABNORMAL HIGH (ref 0.450–4.500)

## 2024-02-12 LAB — LIPID PANEL
Chol/HDL Ratio: 2.6 ratio (ref 0.0–4.4)
Cholesterol, Total: 132 mg/dL (ref 100–199)
HDL: 51 mg/dL (ref >39)
LDL Chol Calc (NIH): 66 mg/dL (ref 0–99)
Triglycerides: 74 mg/dL (ref 0–149)
VLDL Cholesterol Cal: 15 mg/dL (ref 5–40)

## 2024-02-12 LAB — VITAMIN D 25 HYDROXY (VIT D DEFICIENCY, FRACTURES): Vit D, 25-Hydroxy: 25 ng/mL — ABNORMAL LOW (ref 30.0–100.0)

## 2024-02-13 ENCOUNTER — Other Ambulatory Visit (HOSPITAL_COMMUNITY): Payer: Self-pay

## 2024-02-15 NOTE — Assessment & Plan Note (Signed)
 Physical exam as documented. Fasting blood tests today.

## 2024-02-15 NOTE — Assessment & Plan Note (Addendum)
 Well-controlled with Lexapro  20 mg QD, refilled

## 2024-02-15 NOTE — Assessment & Plan Note (Signed)
On Levothyroxine 50 mcg QD Check TSH and free T4 

## 2024-03-03 ENCOUNTER — Encounter: Payer: Self-pay | Admitting: Nurse Practitioner

## 2024-05-12 ENCOUNTER — Other Ambulatory Visit (HOSPITAL_COMMUNITY): Payer: Self-pay

## 2024-05-22 ENCOUNTER — Encounter: Payer: Self-pay | Admitting: Adult Health

## 2024-05-22 ENCOUNTER — Ambulatory Visit: Admitting: Adult Health

## 2024-05-22 VITALS — BP 111/75 | HR 72 | Ht 68.0 in | Wt 173.0 lb

## 2024-05-22 DIAGNOSIS — N63 Unspecified lump in unspecified breast: Secondary | ICD-10-CM

## 2024-05-22 DIAGNOSIS — N644 Mastodynia: Secondary | ICD-10-CM

## 2024-05-22 NOTE — Progress Notes (Signed)
  Subjective:     Patient ID: Olivia Sellers, female   DOB: [birth year], 33 y.o.   MRN: [id number]  HPI Olivia Sellers is a 33 year old white female, with SO, G0P0 in complaining of knot in left breast with pain, noticed knot the weekend and has pain for a while. She is leaving for a cruise this weekend.     Component Value Date/Time   DIAGPAP  02/08/2023 1541    - Negative for intraepithelial lesion or malignancy (NILM)   DIAGPAP  08/20/2019 1100    - Negative for intraepithelial lesion or malignancy (NILM)   HPVHIGH Negative 02/08/2023 1541   HPVHIGH Negative 08/20/2019 1100   ADEQPAP  02/08/2023 1541    Satisfactory for evaluation; transformation zone component PRESENT.   ADEQPAP  08/20/2019 1100    Satisfactory for evaluation; transformation zone component PRESENT.    PCP is Dr Tobie Review of Systems +knot in left breast with pain, noticed the knot this weekend, has had a pain for a while, not sure of period related or not Reviewed past medical,surgical, social and family history. Reviewed medications and allergies.     Objective:   Physical Exam BP 111/75 (BP Location: Left Arm, Patient Position: Sitting, Cuff Size: Normal)   Pulse 72   Ht 5' 8 (1.727 m)   Wt 173 lb (78.5 kg)   LMP 04/24/2024 (Approximate)   BMI 26.30 kg/m      Skin warm and dry,  Breasts:no dominate palpable mass, retraction or nipple discharge on the right, on the left, no retraction or nipple discahrge, has firmness/nodule at 4-5 0' clock, 3 FB from areola that is non mobile and tender to palpation.  Fall risk is low  Upstream - 05/22/24 0837       Pregnancy Intention Screening   Does the patient want to become pregnant in the next year? No    Does the patient's partner want to become pregnant in the next year? No    Would the patient like to discuss contraceptive options today? No      Contraception Wrap Up   Current Method Female Condom    End Method Female Condom          Assessment:     1. Pain  of left breast (Primary) Pain in left breast Will get diagnostic mammogram and US  at [facility] scheduled for 06/03/24 at 2 pm - US  LIMITED ULTRASOUND INCLUDING AXILLA RIGHT BREAST; Future - MM 3D DIAGNOSTIC MAMMOGRAM BILATERAL BREAST; Future - US  LIMITED ULTRASOUND INCLUDING AXILLA LEFT BREAST ; Future  2. Breast nodule Has firmness.nodule at 4-5 0' clock 3 FB from areola Scheduled diagnostic mammogram and US  at [facility] 06/03/24 at 2 pm  - US  LIMITED ULTRASOUND INCLUDING AXILLA RIGHT BREAST; Future - MM 3D DIAGNOSTIC MAMMOGRAM BILATERAL BREAST; Future - US  LIMITED ULTRASOUND INCLUDING AXILLA LEFT BREAST ; Future     Plan:     Follow up prn

## 2024-06-03 ENCOUNTER — Encounter (HOSPITAL_COMMUNITY): Payer: Self-pay

## 2024-06-03 ENCOUNTER — Ambulatory Visit (HOSPITAL_COMMUNITY)
Admission: RE | Admit: 2024-06-03 | Discharge: 2024-06-03 | Disposition: A | Discharge status: Home / Self Care | Source: Ambulatory Visit | Attending: Adult Health | Admitting: Adult Health

## 2024-06-03 ENCOUNTER — Ambulatory Visit (HOSPITAL_COMMUNITY)
Admission: RE | Admit: 2024-06-03 | Discharge: 2024-06-03 | Disposition: A | Source: Ambulatory Visit | Attending: Adult Health | Admitting: Adult Health

## 2024-06-03 DIAGNOSIS — N63 Unspecified lump in unspecified breast: Secondary | ICD-10-CM

## 2024-06-03 DIAGNOSIS — N644 Mastodynia: Secondary | ICD-10-CM | POA: Diagnosis not present

## 2024-06-03 DIAGNOSIS — N6323 Unspecified lump in the left breast, lower outer quadrant: Secondary | ICD-10-CM | POA: Diagnosis not present

## 2024-06-04 ENCOUNTER — Ambulatory Visit: Payer: Self-pay | Admitting: Adult Health

## 2024-08-13 ENCOUNTER — Encounter: Payer: Self-pay | Admitting: Pharmacist

## 2024-08-13 ENCOUNTER — Encounter: Payer: Self-pay | Admitting: Internal Medicine

## 2024-08-13 ENCOUNTER — Ambulatory Visit: Admitting: Internal Medicine

## 2024-08-13 ENCOUNTER — Other Ambulatory Visit (HOSPITAL_COMMUNITY): Payer: Self-pay

## 2024-08-13 VITALS — BP 106/72 | HR 70 | Ht 68.0 in | Wt 178.4 lb

## 2024-08-13 DIAGNOSIS — F331 Major depressive disorder, recurrent, moderate: Secondary | ICD-10-CM | POA: Diagnosis not present

## 2024-08-13 DIAGNOSIS — F418 Other specified anxiety disorders: Secondary | ICD-10-CM | POA: Diagnosis not present

## 2024-08-13 DIAGNOSIS — F5101 Primary insomnia: Secondary | ICD-10-CM | POA: Insufficient documentation

## 2024-08-13 DIAGNOSIS — E039 Hypothyroidism, unspecified: Secondary | ICD-10-CM

## 2024-08-13 MED ORDER — TRAZODONE HCL 50 MG PO TABS
50.0000 mg | ORAL_TABLET | Freq: Every evening | ORAL | 1 refills | Status: AC | PRN
  Filled 2024-08-13 – 2024-08-15 (×2): qty 90, 90d supply, fill #0

## 2024-08-13 MED ORDER — FLUOXETINE HCL 10 MG PO CAPS
10.0000 mg | ORAL_CAPSULE | Freq: Every day | ORAL | 1 refills | Status: AC
  Filled 2024-08-13 – 2024-08-15 (×2): qty 90, 90d supply, fill #0

## 2024-08-13 NOTE — Progress Notes (Signed)
 "  Established Patient Office Visit  Subjective:  Patient ID: Olivia Sellers, female    DOB: [birth year]  Age: 34 y.o. MRN: [id number]  CC:  Chief Complaint  Patient presents with   Hypothyroidism    6 month f/u     HPI Olivia Sellers is a 34 y.o. female with past medical history of hypothyroidism and GAD who presents for f/u of her chronic medical conditions.  Hypothyroidism: her TSH level was elevated in 07/25, but free T4 level was WNL.  She has continued to take levothyroxine  50 mcg QD.  She denies any constipation/diarrhea, recent appetite change. She has gained about 8 lbs since the last visit.  MDD: She reports worsening of anhedonia, anxiety spells and insomnia.  She also reports worsening of her eating habits and trouble concentrating for the last 3 months.  She lost her mother in the last year, and has had arguments with her sister, and has stopped talking to her now. She was doing well with Lexapro  till the last visit.  Today, she reports that she has been taking only half tablet of Lexapro .  She has longstanding history of MDD and history of suicidal attempt in the past. Denies SI or HI currently.  Past Medical History:  Diagnosis Date   Anemia    Anxiety    Cholelithiasis    Depression    Encounter for gynecological examination with Papanicolaou smear of cervix 08/20/2019   Encounter for initial prescription of contraceptive pills 02/19/2020   Encounter for Nexplanon  removal 02/19/2020   General counseling and advice for contraceptive management 08/20/2019   Hyperlipidemia 2021   Insomnia    Nexplanon  insertion 09/09/2019   Pilonidal cyst    hx of   Thyroid  disease 2021    Past Surgical History:  Procedure Laterality Date   CHOLECYSTECTOMY N/A 08/22/2019   Procedure: LAPAROSCOPIC CHOLECYSTECTOMY;  Surgeon: Rubin Calamity, MD;  Location: [facility];  Service: General;  Laterality: N/A;   SURGERY OF LIP     suture to lip at age 11   wisdom teeth  removed      Family History  Problem Relation Age of Onset   Panic disorder Mother    Drug abuse Mother    Alcohol abuse Mother    Depression Mother    COPD Mother    Bipolar disorder Father    Alcohol abuse Father    Heart failure Father    Anemia Sister    Heart disease Paternal Grandmother    Pancreatic cancer Paternal Grandfather    Colon cancer Neg Hx    Breast cancer Neg Hx    Cervical cancer Neg Hx     Social History   Socioeconomic History   Marital status: Significant Other    Spouse name: Not on file   Number of children: Not on file   Years of education: Not on file   Highest education level: Associate degree: academic program  Occupational History   Occupation: RN  Tobacco Use   Smoking status: Never   Smokeless tobacco: Never  Vaping Use   Vaping status: Never Used  Substance and Sexual Activity   Alcohol use: Yes    Alcohol/week: 1.0 standard drink of alcohol    Types: 1 Glasses of wine per week    Comment: socially    Drug use: Never   Sexual activity: Yes    Birth control/protection: Condom  Other Topics Concern   Not on file  Social History Narrative   Lives  with her boyfriend         Enjoys drawing      Diet:  eating fast food a lot, eating more veggies and fruits now, beans-for vegetarian like       Water: takes 2 bottles of water daily       Wears seat belt   Wears sun protection   Smoke detectors    Does not use phone while driving       Social Drivers of Health   Tobacco Use: Low Risk (08/13/2024)   Patient History    Smoking Tobacco Use: Never    Smokeless Tobacco Use: Never    Passive Exposure: Not on file  Financial Resource Strain: Low Risk (02/08/2023)   Overall Financial Resource Strain (CARDIA)    Difficulty of Paying Living Expenses: Not very hard  Food Insecurity: No Food Insecurity (02/08/2023)   Hunger Vital Sign    Worried About Running Out of Food in the Last Year: Never true    Ran Out of Food in the Last  Year: Never true  Transportation Needs: No Transportation Needs (02/08/2023)   PRAPARE - Administrator, Civil Service (Medical): No    Lack of Transportation (Non-Medical): No  Physical Activity: Sufficiently Active (02/08/2023)   Exercise Vital Sign    Days of Exercise per Week: 5 days    Minutes of Exercise per Session: 30 min  Stress: Stress Concern Present (02/08/2023)   [employer] of Occupational Health - Occupational Stress Questionnaire    Feeling of Stress : Rather much  Social Connections: Moderately Isolated (02/08/2023)   Social Connection and Isolation Panel    Frequency of Communication with Friends and Family: Three times a week    Frequency of Social Gatherings with Friends and Family: Once a week    Attends Religious Services: Never    Database Administrator or Organizations: No    Attends Banker Meetings: Never    Marital Status: Living with partner  Intimate Partner Violence: Not At Risk (02/08/2023)   Humiliation, Afraid, Rape, and Kick questionnaire    Fear of Current or Ex-Partner: No    Emotionally Abused: No    Physically Abused: No    Sexually Abused: No  Depression (PHQ2-9): High Risk (08/13/2024)   Depression (PHQ2-9)    PHQ-2 Score: 16  Alcohol Screen: Low Risk (02/08/2023)   Alcohol Screen    Last Alcohol Screening Score (AUDIT): 2  Housing: Low Risk (02/08/2023)   Housing    Last Housing Risk Score: 0  Utilities: Not At Risk (02/08/2023)   AHC Utilities    Threatened with loss of utilities: No  Health Literacy: Not on file    Outpatient Medications Prior to Visit  Medication Sig Dispense Refill   Cholecalciferol  (VITAMIN D3) 25 MCG (1000 UT) CAPS Take 1 capsule (1,000 Units total) by mouth daily. 60 capsule 3   ibuprofen  (ADVIL ) 600 MG tablet Take 1 tablet (600 mg total) by mouth every 8 (eight) hours as needed for up to 30 doses for fever, headache, mild pain (pain score 1-3) or moderate pain (pain score 4-6)  (Inflammation) or for inflammation of upper airways and/or pain. 30 tablet 0   levothyroxine  (SYNTHROID ) 50 MCG tablet Take 1 tablet (50 mcg total) by mouth daily before breakfast. 90 tablet 1   Vit B6-Vit B12-Omega 3 Acids (VITAMIN B PLUS+ PO) Take by mouth.     escitalopram  (LEXAPRO ) 20 MG tablet Take 1 tablet (20 mg total)  by mouth daily. 90 tablet 1   No facility-administered medications prior to visit.    No Known Allergies  ROS Review of Systems  Constitutional:  Negative for chills and fever.  HENT:  Negative for congestion, sinus pressure, sinus pain and sore throat.   Eyes:  Negative for pain and discharge.  Respiratory:  Negative for cough and shortness of breath.   Cardiovascular:  Negative for chest pain and palpitations.  Gastrointestinal:  Negative for abdominal pain, diarrhea, nausea and vomiting.  Endocrine: Negative for polydipsia and polyuria.  Genitourinary:  Negative for dysuria and hematuria.  Musculoskeletal:  Negative for neck pain and neck stiffness.  Skin:  Negative for rash.  Neurological:  Negative for dizziness and weakness.  Psychiatric/Behavioral:  Positive for dysphoric mood and sleep disturbance. Negative for agitation and behavioral problems. The patient is nervous/anxious.       Objective:    Physical Exam Vitals reviewed.  Constitutional:      General: She is not in acute distress.    Appearance: She is not diaphoretic.  HENT:     Head: Normocephalic and atraumatic.     Nose: Nose normal. No congestion.     Mouth/Throat:     Mouth: Mucous membranes are moist.     Pharynx: No posterior oropharyngeal erythema.  Eyes:     General: No scleral icterus.    Extraocular Movements: Extraocular movements intact.  Cardiovascular:     Rate and Rhythm: Normal rate and regular rhythm.     Heart sounds: Normal heart sounds. No murmur heard. Pulmonary:     Breath sounds: Normal breath sounds. No wheezing or rales.  Musculoskeletal:     Cervical  back: Neck supple. No tenderness.     Right lower leg: No edema.     Left lower leg: No edema.  Skin:    General: Skin is warm.     Findings: No rash.  Neurological:     General: No focal deficit present.     Mental Status: She is alert and oriented to person, place, and time.     Sensory: No sensory deficit.     Motor: No weakness.  Psychiatric:        Mood and Affect: Mood is depressed.        Behavior: Behavior is cooperative.     BP 106/72   Pulse 70   Ht 5' 8 (1.727 m)   Wt 178 lb 6.4 oz (80.9 kg)   SpO2 99%   BMI 27.13 kg/m  Wt Readings from Last 3 Encounters:  08/13/24 178 lb 6.4 oz (80.9 kg)  05/22/24 173 lb (78.5 kg)  02/11/24 169 lb 12.8 oz (77 kg)    Lab Results  Component Value Date   TSH 5.340 (H) 02/11/2024   Lab Results  Component Value Date   WBC 7.2 02/11/2024   HGB 13.5 02/11/2024   HCT 41.5 02/11/2024   MCV 90 02/11/2024   PLT 396 02/11/2024   Lab Results  Component Value Date   NA 139 02/11/2024   K 4.5 02/11/2024   CO2 24 02/11/2024   GLUCOSE 92 02/11/2024   BUN 9 02/11/2024   CREATININE 0.61 02/11/2024   BILITOT 0.3 02/11/2024   ALKPHOS 80 02/11/2024   AST 16 02/11/2024   ALT 37 (H) 02/11/2024   PROT 6.6 02/11/2024   ALBUMIN 4.3 02/11/2024   CALCIUM 9.3 02/11/2024   ANIONGAP 11 02/03/2021   EGFR 122 02/11/2024   Lab Results  Component Value Date  CHOL 132 02/11/2024   Lab Results  Component Value Date   HDL 51 02/11/2024   Lab Results  Component Value Date   LDLCALC 66 02/11/2024   Lab Results  Component Value Date   TRIG 74 02/11/2024   Lab Results  Component Value Date   CHOLHDL 2.6 02/11/2024   Lab Results  Component Value Date   HGBA1C 5.0 02/11/2024      Assessment & Plan:   Problem List Items Addressed This Visit       Endocrine   Acquired hypothyroidism - Primary   Lab Results  Component Value Date   TSH 5.340 (H) 02/11/2024   On Levothyroxine  50 mcg QD Check TSH and free T4 - may need to  increase dose of levothyroxine , especially as she is more depressed currently      Relevant Orders   TSH + free T4     Other   Situational anxiety   Uncontrolled with Lexapro  currently Switched to Prozac  for MDD and GAD Advised to restart BH therapy      Relevant Medications   FLUoxetine  (PROZAC ) 10 MG capsule   traZODone  (DESYREL ) 50 MG tablet   MDD (major depressive disorder), recurrent episode, moderate (HCC)   Uncontrolled with Lexapro , although she is taking only 10 mg QD instead of 20 mg dose Due to inadequate response with Lexapro , advised to DC Lexapro  Switch to Prozac  10 mg QD-advised to start 3 days after stopping Lexapro  She used to get [facility] therapy counseling through workplace, advised to restart BH therapy Advised to engage in activities of liking, brief counseling done today      Relevant Medications   FLUoxetine  (PROZAC ) 10 MG capsule   traZODone  (DESYREL ) 50 MG tablet   Primary insomnia   Added trazodone  as needed for insomnia Advised to maintain sleep hygiene      Relevant Medications   traZODone  (DESYREL ) 50 MG tablet     Meds ordered this encounter  Medications   FLUoxetine  (PROZAC ) 10 MG capsule    Sig: Take 1 capsule (10 mg total) by mouth daily.    Dispense:  90 capsule    Refill:  1   traZODone  (DESYREL ) 50 MG tablet    Sig: Take 1 tablet (50 mg total) by mouth at bedtime as needed for sleep.    Dispense:  90 tablet    Refill:  1    Follow-up: Return in about 3 months (around 11/11/2024) for MDD and hypothyroidism.    Suzzane MARLA Blanch, MD "

## 2024-08-13 NOTE — Assessment & Plan Note (Addendum)
 Lab Results  Component Value Date   TSH 5.340 (H) 02/11/2024   On Levothyroxine  50 mcg QD Check TSH and free T4 - may need to increase dose of levothyroxine , especially as she is more depressed currently

## 2024-08-13 NOTE — Assessment & Plan Note (Addendum)
 Uncontrolled with Lexapro , although she is taking only 10 mg QD instead of 20 mg dose Due to inadequate response with Lexapro , advised to DC Lexapro  Switch to Prozac  10 mg QD-advised to start 3 days after stopping Lexapro  She used to get [facility] therapy counseling through workplace, advised to restart [facility] therapy Advised to engage in activities of liking, brief counseling done today

## 2024-08-13 NOTE — Patient Instructions (Signed)
 Please stop taking Lexapro . Please start taking Prozac  after 3 days.  Please continue to take medications as prescribed.  Please continue to follow low carb diet and perform moderate exercise/walking at least 150 mins/week.

## 2024-08-13 NOTE — Assessment & Plan Note (Signed)
 Uncontrolled with Lexapro  currently Switched to Prozac  for MDD and GAD Advised to restart [facility] therapy

## 2024-08-13 NOTE — Assessment & Plan Note (Signed)
 Added trazodone  as needed for insomnia Advised to maintain sleep hygiene

## 2024-08-14 ENCOUNTER — Other Ambulatory Visit (HOSPITAL_COMMUNITY): Payer: Self-pay

## 2024-08-14 ENCOUNTER — Ambulatory Visit: Payer: Self-pay | Admitting: Internal Medicine

## 2024-08-14 LAB — TSH+FREE T4
Free T4: 1.04 ng/dL (ref 0.82–1.77)
TSH: 4.32 u[IU]/mL (ref 0.450–4.500)

## 2024-08-14 MED ORDER — LEVOTHYROXINE SODIUM 50 MCG PO TABS
50.0000 ug | ORAL_TABLET | Freq: Every day | ORAL | 1 refills | Status: AC
  Filled 2024-08-14 – 2024-08-15 (×2): qty 90, 90d supply, fill #0

## 2024-08-14 NOTE — Addendum Note (Signed)
 Addended byBETHA TOBIE DOWNS on: 08/14/2024 08:00 AM   Modules accepted: Orders

## 2024-08-15 ENCOUNTER — Other Ambulatory Visit (HOSPITAL_COMMUNITY): Payer: Self-pay

## 2024-11-13 ENCOUNTER — Ambulatory Visit: Payer: Self-pay | Admitting: Internal Medicine
